# Patient Record
Sex: Male | Born: 1960 | Race: White | Hispanic: No | Marital: Married | Smoking: Never smoker
Health system: Northeastern US, Academic
[De-identification: ages and names within clinical notes are randomized; demographics above are authoritative.]

---

## 2015-09-06 ENCOUNTER — Ambulatory Visit: Admitting: Internal Medicine

## 2015-09-06 LAB — HX BASIC METABOLIC PANEL
CASE NUMBER: 2017163001029
HX ANION GAP: 9 (ref 3.0–11.0)
HX BUN: 14 mg/dL (ref 7.0–18.0)
HX CALCIUM LVL: 9.2 mg/dL (ref 8.5–10.1)
HX CHLORIDE: 102 mmol/L (ref 98.0–107.0)
HX CO2: 29 mmol/L (ref 21.0–32.0)
HX CREATININE: 0.988 mg/dL (ref 0.55–1.3)
HX GLUCOSE LVL: 106 mg/dL (ref 65.0–110.0)
HX POTASSIUM LVL: 4.9 mmol/L (ref 3.6–5.2)
HX SODIUM LVL: 140 mmol/L (ref 136.0–145.0)

## 2015-09-06 LAB — HX CBC W/ DIFF
CASE NUMBER: 2017163001029
HX HCT: 45.4 % (ref 39.0–50.0)
HX HGB: 14.9 g/dL (ref 13.0–17.0)
HX MCH: 29 pg (ref 27.0–31.0)
HX MCHC: 32.8 g/dL (ref 32.0–36.0)
HX MCV: 87 fL (ref 80.0–94.0)
HX MPV: 10.4 fL (ref 7.4–11.5)
HX NRBC PERCENT: 0 %
HX PLATELET: 245 10*3/uL (ref 150.0–400.0)
HX RBC: 5.2 10*6/uL (ref 4.2–5.4)
HX RDW: 12.7 % (ref 11.5–14.5)
HX WBC: 8.8 10*3/uL (ref 3.6–10.5)

## 2015-09-06 LAB — HX .AUTOMATED DIFF
CASE NUMBER: 2017163001029
HX ABSOLUTE NEUTRO COUNT: 6230 /mm3
HX BASOPHILS: 1 % (ref 0.0–1.0)
HX EOSINOPHILS: 2 % (ref 0.0–3.0)
HX IMMATURE GRANULOCYTES: 0 % (ref 0.0–2.0)
HX LYMPHOCYTES: 18 % — ABNORMAL LOW (ref 22.0–40.0)
HX MONOCYTES: 8 % (ref 0.0–11.0)
HX NEU CT MEASURED: 6.23
HX NEUTROPHILS: 71 % (ref 40.0–71.0)

## 2015-09-06 LAB — HX LIPID PANEL
CASE NUMBER: 2017163001029
HX CHOL: 181 mg/dL (ref 140.0–200.0)
HX HDL: 62 mg/dL — ABNORMAL HIGH (ref 41.0–60.0)
HX LDL: 104 mg/dL (ref 0.0–129.0)
HX TRIG: 73 mg/dL (ref 0.0–149.0)

## 2015-09-06 LAB — HX URINALYSIS (COMPLETE)
CASE NUMBER: 2017163001030
HX UA BILIRUBIN: NEGATIVE
HX UA BLOOD: NEGATIVE
HX UA GLUCOSE: NEGATIVE
HX UA KETONES: NEGATIVE
HX UA LEUKOCYTE ESTERASE: NEGATIVE
HX UA NITRITE: NEGATIVE
HX UA PH: 6 (ref 5.0–8.0)
HX UA PROTEIN: NEGATIVE
HX UA RBC: 2 /HPF (ref 0.0–3.0)
HX UA SPECIFIC GRAVITY: 1.011 (ref 1.005–1.03)
HX UA SQUAMOUS EPITHELIAL: <1 (ref 0.0–5.0)
HX UA UROBILINOGEN: NEGATIVE
HX UA WBC: 1 /HPF (ref 0.0–5.0)

## 2015-09-06 LAB — HX GLOMERULAR FILTRATION RATE (ESTIMATED)
CASE NUMBER: 2017163001029
HX AFN AMER GLOMERULAR FILTRATION RATE: >90
HX NON-AFN AMER GLOMERULAR FILTRATION RATE: 85 mL/min/{1.73_m2}

## 2015-09-06 LAB — HX HEPATITIS C ANTIBODY
CASE NUMBER: 2017163001029
HX HEP C AB: NONREACTIVE

## 2015-09-06 LAB — HX PROSTATE SPECIFIC ANTIGEN SCREEN
CASE NUMBER: 2017163001029
HX PSA: 3.01 ng/mL (ref 0.0–4.0)

## 2016-06-10 ENCOUNTER — Ambulatory Visit: Admitting: Internal Medicine

## 2016-06-10 NOTE — Progress Notes (Signed)
* * *        **  Jordan [name], Jordan [name]**    ------    72 Y old Male, DOB: 1960-06-07    [address] [place], [place], [place] [address]    Home: [phone]    Provider: [name]        * * *    Web Encounter    ---    Answered by    Blenda Nicely    Date: 06/10/2016        Time: 07:36 PM    Caller    [name]    ------            Reason    Medical records            Message                       Addressed To:                [name]        <br/> My psychologist said they requested a copy of my medical records.  please let me know what I need to do to give them access to this information.      [facility]      [address]      [place], [place]       [employer]            Thank You, [name]                Action Taken    [facility] 06/12/2016 8:59:57 AM > Faxed records to  [phone]. Sent portal message to patient.                * * *        **eMessages**   From:    Blenda Nicely    ------    Created:    2016-06-12 10:41:30.0    Sent:      Subject:    GM:WNUUVOZ records    Message:    Hi [name]: I have faxed your records to [facility],  Attention: Mardee Postin for you. Regards. Trish                    ---          * * *          Patient: Jordan [name], Jordan [name] DOB: 1960-12-01 Provider: [name]  06/10/2016    ---    Note generated by eClinicalWorks EMR/PM Software ([email or web address])

## 2016-06-14 ENCOUNTER — Ambulatory Visit: Admitting: Internal Medicine

## 2016-06-14 NOTE — Progress Notes (Signed)
* * *        **  TIP, [name]**    ------    69 Y old Male, DOB: May 18, 1960    [address], [place], [place], [place] [address]    Home: [phone]    Provider: [name]        * * *    Web Encounter    ---    Answered by    Blenda Nicely    Date: 06/14/2016        Time: 04:15 PM    Caller    [name]    ------            Reason    Re:RE:Medical records            Message                            Great - Thank you                  .[name]..................................................................-      To :[name]      From :[facility]      Sent :06/12/2016 10:42 AM      Subject :IO:NGEXBMW records            Hi [name]:  I have faxed your records to [facility], Attention:  Mardee Postin for you.  Regards.  Trish                      Action Taken    [employer] 06/15/2016 8:52:33 AM >                * * *                ---          * * *          Patient: [name], [name] DOB: Aug 11, 1960 Provider: [name]  06/14/2016    ---    Note generated by eClinicalWorks EMR/PM Software ([email or web address])

## 2016-06-26 ENCOUNTER — Ambulatory Visit: Admitting: Medical

## 2016-06-26 LAB — HX COMPREHENSIVE METABOLIC PANEL
CASE NUMBER: 2018092002219
HX ALBUMIN LVL: 4.3 g/dL (ref 3.5–5.0)
HX ALKALINE PHOSPHATASE: 58 U/L (ref 45.0–117.0)
HX ALT: 26 U/L (ref 6.0–78.0)
HX ANION GAP: 6 (ref 3.0–11.0)
HX AST: 15 U/L (ref 6.0–40.0)
HX BILIRUBIN TOTAL: 0.4 mg/dL (ref 0.2–1.2)
HX BUN: 16 mg/dL (ref 7.0–18.0)
HX CALCIUM LVL: 9.2 mg/dL (ref 8.5–10.1)
HX CHLORIDE: 101 mmol/L (ref 98.0–110.0)
HX CO2: 29 mmol/L (ref 21.0–32.0)
HX CREATININE: 0.941 mg/dL (ref 0.55–1.3)
HX GLUCOSE LVL: 83 mg/dL (ref 65.0–110.0)
HX POTASSIUM LVL: 4.6 mmol/L (ref 3.6–5.2)
HX SODIUM LVL: 136 mmol/L (ref 136.0–145.0)
HX TOTAL PROTEIN: 7.4 g/dL (ref 6.0–8.0)

## 2016-06-26 LAB — HX GLOMERULAR FILTRATION RATE (ESTIMATED)
CASE NUMBER: 2018092002219
HX AFN AMER GLOMERULAR FILTRATION RATE: >90
HX NON-AFN AMER GLOMERULAR FILTRATION RATE: >90

## 2016-06-26 LAB — HX AMYLASE LEVEL
CASE NUMBER: 2018092002219
HX AMYLASE LVL: 53 U/L (ref 25.0–115.0)

## 2016-06-26 LAB — HX LIPASE LEVEL
CASE NUMBER: 2018092002219
HX LIPASE LVL: 201 U/L (ref 73.0–393.0)

## 2016-06-26 LAB — HX CBC W/ DIFF
CASE NUMBER: 2018092002219
HX HCT: 43.1 % (ref 39.0–50.0)
HX HGB: 14.6 g/dL (ref 13.0–17.0)
HX MCH: 29 pg (ref 27.0–31.0)
HX MCHC: 33.9 g/dL (ref 32.0–36.0)
HX MCV: 84 fL (ref 80.0–94.0)
HX MPV: 9.7 fL (ref 7.4–11.5)
HX NRBC PERCENT: 0 %
HX PLATELET: 225 10*3/uL (ref 150.0–400.0)
HX RBC: 5.11 10*6/uL (ref 4.2–5.4)
HX RDW: 12.6 % (ref 11.5–14.5)
HX WBC: 8.9 10*3/uL (ref 3.6–10.5)

## 2016-06-26 LAB — HX .AUTOMATED DIFF
CASE NUMBER: 2018092002219
HX ABSOLUTE NEUTRO COUNT: 5770 /mm3
HX BASOPHILS: 1 % (ref 0.0–1.0)
HX EOSINOPHILS: 2 % (ref 0.0–3.0)
HX IMMATURE GRANULOCYTES: 1 % (ref 0.0–2.0)
HX LYMPHOCYTES: 23 % (ref 22.0–40.0)
HX MONOCYTES: 9 % (ref 0.0–11.0)
HX NEU CT MEASURED: 5.77
HX NEUTROPHILS: 65 % (ref 40.0–71.0)

## 2016-06-26 NOTE — Progress Notes (Signed)
* * *        **Couser, Gianny M**    ------    56 Y old Male, DOB: [birth year]    Account Number: [id number]    [address], [place], [place]    Home: [phone]    Guarantor: [name] Insurance: [employer] [place], [place] Payer ID:  [id number]    PCP: [name]    Appointment Facility: [facility]        * * *    06/26/2016    Progress Note: Consuelo Pandy, PA    ------    ---        Current Medications    ---    Taking      * lisinopril 10 mg tablet 1 tab(s) orally once a day     ---    * lamotrigine 100 mg tablet 1 tab(s) orally Once a day     ---    * clonazepam 1 mg tablet 1 tab(s) orally Once a day     ---    * Vitamin D3 2000 intl units tablet 2 cap(s) orally once a day     ---    * gabapentin 300 mg capsule 1 cap(s) orally Once a day     ---    * Fluoxetine Hydrochloride 40 mg capsule 1 cap(s) orally once a day     ---    Discontinued    * alprazolam 1 mg tablet 1/2 tab(s) orally BID     ---    * Wellbutrin XL 150 mg/24 hours tablet, extended release 1 tab(s) orally every 24 hours     ---    * losartan 25 mg tablet 1 tab(s) orally once a day     ---    * Medication List reviewed and reconciled with the patient     ---      Past Medical History    ---      L2-3 R-side disc bulge and osteophyte- Spinal Stenosis he has been  Injection stem cells, trying to avoid surgery. [name]        ---    Depression and Anxiety.        ---    HTN.        ---    Colonoscopy 2008 Dr [name] repeat in 2018 no polyps.        ---    2014 Elevated PSA Dr [name]. Status post biopsy.        ---    Insomnia anxiety related.        ---    Allergic rhinitis.        ---    Hx of H pylori.        ---    Psych: [facility] Mardee Postin NP.        ---      Family History    ---      Father: deceased, HTN, Kidney failure, Dyalsis, Heart failure, diagnosed  with Hypertension, Heart Disease    ---    Mother: alive 68 yrs, HTN, diagnosed with Hypertension    ---    Daughter  1: alive 55 yrs    ---    Paternal Grand Father: deceased    ---    Paternal Grand Mother: deceased 14 yrs    ---    Maternal Grand Father: deceased, CHF, diagnosed with Heart Disease    ---  Maternal Grand Mother: deceased, cancer at age 32, diagnosed with Cancer    ---    1 sister(s) - healthy. 1 son(s) , 2 daughter(s) - healthy.    ---    Came to this country when he was 9, [place]    Daughter- Depression issues.    ---      **Social History**    ---    Tobacco Use  Are you a? Never smoker  , Patient was counseled on the dangers  of tobacco use: 06/26/2016  .    no Drug use.    Marital Status: married, 34 yrs married.    Children: sons:1 daughters:2.    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    no Exercise, should be doing more .    no Caffeine, due to high blood pressure.    Sexually active Had sex in the last 12 months (vaginal,anal,oral): Yes, with:  Women only, Use Protection?: No, Prevention strategies discussed: Other, Have  you ever had an STD?: No.    Alcohol (TEXT ONLY): socially.      Allergies    ---      Tdap injection: swelling : Side Effects    ---    Cymbalta: constipation : Side Effects    ---        Reason for Appointment    ---      1\. High BP 155/95 HR 65, 2wks    ---      History of Present Illness    ---    _Interim History_ :    56 yr old male, PMHx and MEDS per below, here c/o elevated BP at home 160/90  for 1 week, mild head pressure and persistent tinnitus which is not new. At  the end of the visit he told me has several months of decreased sense of taste  and smell. Denies fever, chills, sinus pain , pressure, signs of sinus  infection.    He is Also c/o abdominal bloating and early satiety, constantly feeling full.  Denies fever, chills, n/v/d. He has Hx H Pylori, this feels similar. Denies  vomiting, blood in the stool, melena, unexplained weight loss.    He Started Lamotrigine about 1 month ago, 25 mg and working up to 100 mg now.  ? if abdominal symptoms are side effects. He  discussed w/ psychiatrist who did  not think so.      Vital Signs    ---    Wt 165, Ht 66, BP  154/90  ,  **Repeat:162/90** , BMI  26.63  , HR 88, O2 Sat  98%RA.      Examination    ---    Denton Meek Examination_ :    General Appearance:  NAD  ,  alert and oriented  .    Oral cavity:  clear  ,  moist mucus membranes  .    HEENT:  eyes: anicteric.  TM's clear and flat bilaterally  ,  Oropharynx clear  with MMM  .    Neck, Thyroid :  supple  ,  no thyromegaly  ,  no lymphadenopathy  .    Heart:  RRR  ,  no murmurs  .    Lungs:  clear to auscultation bilaterally  ,  no wheezes/rhonchi/rales  .    Abdomen:  nl BS, mild distended  ,  no guarding or rebound  ,  no masses  palpated  ,  no hepatosplenomegaly  .    Extremities:  no  clubbing, no edema  .    Peripheral pulses:  normal  .    Neurologic Exam:  non-focal exam  ,  normal cranial nerves II-XII sensory &  motor WNL, DTR 2 plus  ,  Speech normal  ,  gait normal  ,  no cerebellar  signs  ,  Finger to nose - normal  ,  Romberg - negative  .          Assessments    ---    1\. Essential hypertension - I10 (Primary)    ---    2\. Influenza vaccine refused - Z28.21    ---    3\. Early satiety - R68.81    ---    4\. Depression with anxiety - F41.8    ---    5\. Dysgeusia - R43.2    ---    6\. Anosmia - R43.0    ---      Treatment    ---      **1\. Essential hypertension**    Stop lisinopril tablet, 10 mg, 1 tab(s), orally, once a day    Start lisinopril tablet, 20 mg, 1 tab(s), orally, once a day, 90 day(s), 90,  Refills 3    _IMAGING: EKG_    _IMAGING: Echocardiogram-[facility] (Ordered for 06/26/2016)_    Notes: not to goal. Increase Lisinopril to 20 mg, he will double up on his 10  mg, then fill new rx. f/u BP in 1 week, sooner prn. Discussed importance of  keeping blood pressure well controlled. DASH diet. Take medications daily as  prescribed. Regular cardiovascular exercise 30 min. most days of the week.    EKG w/ LAD. check echo to r/o hypertensive heart disease.    ---         **2\. Influenza vaccine refused**    Notes: pt aware influenza vaccine recommended annually, defers at this time...  and may change their mind and thus refered to Pharmacy if that occurs.        **3\. Early satiety**    _LAB: Amylase Level_    _LAB: CBC w/ Differential_    _LAB: Comprehensive Metabolic Panel_    _LAB: Helicobacter Pylori Antigen_    _LAB: Lipase Level_    Notes: New since starting and titrating Lamotrigine, ? side effect. exam is  benign. ddx also includes GERD, H pylor ( Hx of same ), gastritis,  cholelithiasis, mass ( low suspicion ). Check labs as noted. f/u in 1 week,  sooner prn. if sxs persist, consider imaging such as abd u/s and or CT.    doubt central cause, but given sxs of anosmia and dysgeusia, will check CT  head. consider MRI brain if unrevealing.        **4\. Depression with anxiety**    Continue clonazepam tablet, 1 mg, 1 tab(s), orally, Once a day    Notes: cont. w/ psych. Mood is stable, but struggling w/ weaning from Benzo  and adding Lamotrigine.        **5\. Dysgeusia**    _IMAGING: CT Head or Brain C- e-sch* (Ordered for 06/26/2016)_    Notes: checking CT head, if unrevealing, consider MRI.        **6\. Anosmia**    Notes: see above.        **7\. Others**    Continue lamotrigine tablet, 100 mg, 1 tab(s), orally, Once a day    Continue Vitamin D3 tablet, 2000 intl units, 2 cap(s), orally, once a day    Continue gabapentin  capsule, 300 mg, 1 cap(s), orally, Once a day    Continue Fluoxetine Hydrochloride capsule, 40 mg, 1 cap(s), orally, once a day    Notes: Preventive health care material was published to portal.      Procedure Codes    ---      93000 ELECTROCARDIOGRAM    ---      Follow Up    ---    1 week BP check and f/u echo and CT brain--    Electronically signed by [name] , M.D. on 07/04/2016 at 12:54 PM EDT    Sign off status: Completed        * * *        [facility]    [address]    [place], [place] [address]    Tel: [phone]    Fax:  [phone]              * * *          Patient: JYE, FARISS DOB: 12/24/1960 Progress Note: Consuelo Pandy, PA  06/26/2016    ---    Note generated by eClinicalWorks EMR/PM Software ([email or web address])

## 2016-06-27 ENCOUNTER — Ambulatory Visit: Admitting: Medical

## 2016-06-29 LAB — HX HELICOBACTER PYLORI ANTIGEN
CASE NUMBER: 2018093001715
HX H PYLORI AG: NEGATIVE

## 2016-07-03 ENCOUNTER — Ambulatory Visit: Admitting: Medical

## 2016-07-04 ENCOUNTER — Ambulatory Visit: Admitting: Medical

## 2016-07-04 NOTE — Progress Notes (Signed)
* * *        **Jordan [name], Jordan [name]**    ------    56 Y old Male, DOB: Aug 21, 1960    Account Number: [id number]    [address] [place], [place]    Home: [phone]    Guarantor: [name] Insurance: [employer] [place], [place] Payer ID:  [id number]    PCP: [name]    Appointment Facility: [facility]        * * *    07/04/2016    Progress Note: Consuelo Pandy, PA    ------    ---        Current Medications    ---    Taking      * lisinopril 20 mg tablet 1 tab(s) orally once a day     ---    * lamotrigine 25 mg tablet 3 tab(s) orally Once a day     ---    * clonazepam 1 mg tablet 1 tab(s) orally Once a day     ---    * Vitamin D3 2000 intl units tablet 2 cap(s) orally once a day     ---    * gabapentin 300 mg capsule 1 cap(s) orally Once a day     ---    * Fluoxetine Hydrochloride 40 mg capsule 1 cap(s) orally once a day     ---    * Medication List reviewed and reconciled with the patient     ---      Past Medical History    ---      L2-3 R-side disc bulge and osteophyte- Spinal Stenosis he has been  Injection stem cells, trying to avoid surgery. [name]        ---    Depression and Anxiety.        ---    HTN.        ---    Colonoscopy 2008 Dr [name] repeat in 2018 no polyps.        ---    2014 Elevated PSA Dr [name]. Status post biopsy.        ---    Insomnia anxiety related.        ---    Allergic rhinitis.        ---    Hx of H pylori.        ---    [facility]; [name] therapist 1  x per month.        ---    Echo- normal function. EF 65 %. mildly dilated ascending aorta... repeat in 6  months..        ---      **Social History**    ---    Tobacco Use  Are you a? Never smoker  , Patient was counseled on the dangers  of tobacco use: 07/04/2016  .    no Drug use.    Marital Status: married, 57 yrs married.    Children: sons:1 daughters:2.    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    no Exercise, should be doing more .    no Caffeine,  due to high blood pressure.    Sexually active Had sex in the last 12 months (vaginal,anal,oral): Yes, with:  Women only, Use Protection?: No, Prevention strategies discussed: Other, Have  you ever had an STD?: No.    Alcohol (TEXT ONLY): socially.      Allergies    ---  Tdap injection: swelling : Side Effects    ---    Cymbalta: constipation : Side Effects    ---        Reason for Appointment    ---      1\. 1 wk f/u fBP    ---      History of Present Illness    ---    _Interim History_ :    56 yr old male, PMHx and MEDS per below, here for 1 week BP check. Lisinopril  was increased from 10 mg to 20 mg last week. He is still getting some high  readings at home 150's/ 80-90. Still c/o mild head pressure and persistent  tinnitus which is not new.    Abd bloating , early satiety still present, but not as bad. Labs were un  revealing. H pylori was negative. Denies vomiting, blood in the stool, melena,  unexplained weight loss.    He still thinks sxs are related to Lamotrigine and will again discuss w/ his  psych prescriber. She advised him to decrease from 100 mg to 75 mg, but didn't  think sxs were due to med.    He had to reschedule CT head ordered for anosmia and dysgeusia. There have  been no changes in his symptoms.      Vital Signs    ---    Wt 165, Ht 66, BP  120/90  , BMI  26.63  , HR 76, O2 Sat 98%RA.      Examination    ---    Denton Meek Examination_ :    General Appearance:  NAD  ,  alert and oriented  .    Oral cavity:  clear  ,  moist mucus membranes  .    HEENT:  eyes: anicteric.  TM's clear and flat bilaterally  ,  Oropharynx clear  with MMM  .    Neck, Thyroid :  supple  ,  no thyromegaly  ,  no lymphadenopathy  .    Heart:  RRR  ,  no murmurs  .    Lungs:  clear to auscultation bilaterally  ,  no wheezes/rhonchi/rales  .    Abdomen:  nl BS, mild distended  ,  no guarding or rebound  ,  no masses  palpated  ,  no hepatosplenomegaly  .    Extremities:  no clubbing, no edema  .    Peripheral  pulses:  normal  .    Neurologic Exam:  non-focal exam  ,  normal cranial nerves II-XII sensory &  motor WNL, DTR 2 plus  ,  Speech normal  ,  gait normal  ,  no cerebellar  signs  ,  Finger to nose - normal  ,  Romberg - negative  .          Assessments    ---    1\. Essential hypertension - I10 (Primary)    ---    2\. Influenza vaccine refused - Z28.21    ---    3\. Depression with anxiety - F41.8    ---    4\. Dysgeusia - R43.2    ---    5\. Anosmia - R43.0    ---    6\. Bloating - R14.0    ---      Treatment    ---      **1\. Essential hypertension**    Start hydrochlorothiazide-lisinopril tablet, 25 mg-20 mg, 1 tab(s), orally,  once a day, 30  day(s), 30, Refills 6    Continue lisinopril tablet, 20 mg, 1 tab(s), orally, once a day    Notes: Still not to goal. Adding HCTZ to Lisinopril. f/u BP iin 2-3 weeks,  sooner prn. cont to avoid NSAIDs. Cont w/ DASH diet, exercise.    Echo- normal function. EF 65 %. mildly dilated ascending aorta... repeat in 6  months- see next visit and we 07/05/2016.    ---        **2\. Influenza vaccine refused**    Notes: pt aware influenza vaccine recommended annually, defers at this time...  and may change their mind and thus refered to Pharmacy if that occurs.        **3\. Depression with anxiety**    Continue clonazepam tablet, 1 mg, 1 tab(s), orally, Once a day    Notes: cont care w/ psych. he will again discuss meds, as he is frustrated w/  how he is feeling and really belives sxs worse w/ Lamotrigine.        **4\. Dysgeusia**    _IMAGING: Consult (Ordered for 07/04/2016)_    Notes: pending CT, refer to neurology.        **5\. Anosmia**    Notes: pending CT, refer to neurology.        **6\. Bloating**    Notes: New since starting and titrating Lamotrigine, ? side effect. exam is  benign. H pylor negative. DX gastritis,GERD, cholelithiasis, mass ( low  suspicion ). labs were stable. sxs are improved, though not resolved. He is  still working w/ psych to change MEDS. if no relief  of GI sxs, pursue further  work up. weight stable.        **7\. Others**    Continue lamotrigine tablet, 25 mg, 3 tab(s), orally, Once a day    Continue Vitamin D3 tablet, 2000 intl units, 2 cap(s), orally, once a day    Continue gabapentin capsule, 300 mg, 1 cap(s), orally, Once a day    Continue Fluoxetine Hydrochloride capsule, 40 mg, 1 cap(s), orally, once a day    Notes: Preventive health care material was published to portal.      Follow Up    ---    2-3 weks BP check.    Electronically signed by [name] , [name] on 07/10/2016 at 07:47 PM EDT    Sign off status: Completed        * * *        Jordan [name]    [address]    [place], [place] [address]    Tel: [phone]    Fax: [phone]              * * *          Patient: LAVARR, PRESIDENT DOB: [birth year] Progress Note: Consuelo Pandy, PA  07/04/2016    ---    Note generated by eClinicalWorks EMR/PM Software ([email or web address])

## 2016-07-05 ENCOUNTER — Ambulatory Visit: Admitting: Internal Medicine

## 2016-07-05 NOTE — Progress Notes (Signed)
* * *        **  Jordan [name], Jordan [name]**    ------    72 Y old Male, DOB: 06-Nov-1960    [address], [place], [place], [place] [address]    Home: [phone]    Provider: [name]        * * *    Web Encounter    ---    Answered by    Consuelo Pandy    Date: 07/05/2016        Time: 02:16 PM    Reason    Echo    ------            Message                      Echo- normal function. EF 65 %. mildly dilated ascending aorta... repeat in 6 months                Action Taken    Mansur,Tarina 07/05/2016 2:16:46 PM > Hi Kenneth, I left you a  message as well. I know we discussed your echo yesterday. It does show normal  function. There is mild dilation of the ascending aorta ( the large vessel  that comes off of the heart ).. this can sometimes happen w/ high blood  pressure. we will just need to watch this. repeat the echo in 6 months and  ensure we get the Blood pressure controlled. we will see you in a couple of  weeks as planned. take care, Consuelo Pandy, PA-C                * * *        **eMessages**   From:    Mansur,Tarina    ------    Created:    2016-07-05 14:18:35.0    Sent:      Subject:    BJ:YNWG    Message:                      Mansur,Tarina  07/05/2016 2:16:46 PM >  Hi Demetrio, I left you a message as well. I know we discussed your echo yesterday.  It does show normal function.  There is mild dilation of the ascending aorta ( the large vessel that comes off of the heart )..  this can sometimes happen w/ high blood pressure.  we will just need to watch this.  repeat the echo in 6 months and ensure we get the Blood pressure controlled. we will see you in a couple of weeks as planned. take care, Consuelo Pandy, PA-C                        ---          * * *          Patient: Jordan [name], Jordan [name] DOB: [birth year] Provider: [name]  07/05/2016    ---    Note generated by eClinicalWorks EMR/PM Software ([email or web address])

## 2016-07-06 ENCOUNTER — Ambulatory Visit: Admitting: Internal Medicine

## 2016-07-06 ENCOUNTER — Ambulatory Visit

## 2016-07-06 LAB — HX VITAMIN D 25 HYDROXY LEVEL (RECOMMENDED)
CASE NUMBER: 2018102000767
HX VITAMIN D 25 OH LVL: 31 ng/mL (ref 30.0–100.0)

## 2016-07-06 LAB — HX IRON PROFILE
CASE NUMBER: 2018102000767
HX % IRON BOUND: 27 % (ref 10.0–50.0)
HX FERRITIN LVL: 250 ng/mL (ref 26.0–388.0)
HX IRON: 95 ug/dL (ref 65.0–175.0)
HX TIBC: 347 ug/dL (ref 250.0–450.0)

## 2016-07-06 LAB — HX MAGNESIUM LEVEL
CASE NUMBER: 2018102000767
HX MAGNESIUM LVL: 2.2 mg/dL (ref 1.8–2.4)

## 2016-07-06 LAB — HX VITAMIN B12 LEVEL
CASE NUMBER: 2018102000767
HX VITAMIN B12 LVL: 1805 pg/mL — ABNORMAL HIGH (ref 193.0–986.0)

## 2016-07-06 LAB — HX TSH
CASE NUMBER: 2018102000767
HX 3RD GEN TSH: 2.16 u[IU]/mL (ref 0.358–3.74)

## 2016-07-06 NOTE — Progress Notes (Signed)
* * *        **  Jordan [name], Jordan [name]**    ------    32 Y old Male, DOB: 11/21/1960    [address], [place], [place], [place] [address]    Home: [phone]    Provider: [name]        * * *    Web Encounter    ---    Answered by    Carlynn Herald    Date: 07/06/2016        Time: 08:20 AM    Reason    fyi    ------            Message                      Patient stopped by the office to Let TM know he is doing very well and thanks for your concern. He will be seeing you on his f/u in May 1st.                Action Taken    Caldas,Dieniffer 07/06/2016 8:21:38 AM > Mansur,Tarina  07/06/2016 11:35:21 AM >                * * *                ---          * * *          Patient: Jordan [name], Jordan [name] DOB: 09/20/1960 Provider: [name]  07/06/2016    ---    Note generated by eClinicalWorks EMR/PM Software ([email or web address])

## 2016-07-07 LAB — HX TESTOSTERONE, FREE TOTAL MALE
CASE NUMBER: 2018102000767
HX SEX HORMONE BINDING GLOBULIN: 43 nmol/L
HX TESTOS FREE: 65 pg/mL
HX TESTOSTER % FREE: 1.6 %
HX TESTOSTERONE, ADULT MALE: 410 ng/dL

## 2016-07-07 LAB — HX LAMOTRIGINE LEVEL
CASE NUMBER: 2018102000767
HX LAMOTRIGINE LVL: <0.9 — ABNORMAL LOW

## 2016-07-07 LAB — HX LYME ANTIBODIES W/ RFLX IB
CASE NUMBER: 2018102000767
HX LYME ABS TOT: 0.18 {index}

## 2016-07-08 LAB — HX GABAPENTIN LEVEL
CASE NUMBER: 2018102000767
HX GABAPENTIN LVL: 2 ug/mL

## 2016-07-10 ENCOUNTER — Ambulatory Visit: Admitting: Medical

## 2016-07-10 NOTE — Progress Notes (Signed)
* * *        **[name], [name]**    ------    58 Y old Male, DOB: 02/07/1961    [address], [place], [place], [place] [address]    Home: [phone]    Provider: Consuelo [name]        * * *    Web Encounter    ---    Answered by    Jordan [name]    Date: 07/10/2016        Time: 06:11 PM    Reason    abdominal bloating, early satiety/ head CT/ pending pt response    ------            Message                      He is still working w/ psych to change MEDS. if no relief of GI sxs, pursue further work up. weight stable.                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 pending neuro consult 08/14/16 Khrystyna Schwalm 07/12/2016  5:00:34 PM >called pt. LM. Hi Jordan [name], the CT showed sinus inflammation and  possibly the cerebllar tonsils are laying low. this could be causing the loss  of taste and smell. please keep appt w/ neurologist, as you may need further  work up and MRI .[name]. in the meantime, i will send a prescription for augmentin  to treat the sinus infection. please call or respond here so we know you have  rec'd this message. Also, we will consider referral to Gastroenterologist if  you cntinue to have stomach issues. please let me know. thanks, Jordan [name], Jordan [name]  Ples Trudel 07/13/2016 12:20:25 PM > visit 5/1            Refills    Start Augmentin tablet, 875 mg-125 mg, orally, 20, 1 tab(s), every  12 hours, 10 day(s), Refills=0    ------          * * *        **eMessages**   From:    Jordan [name]    ------    Created:    2016-07-12 17:04:43.0    Sent:    2016-07-12 17:06:31.0    Subject:    QI:ONGEXBMWU bloating, early satiety/ head CT    Message:                      Hi Jordan [name], the CT showed sinus inflammation and possibly the cerebllar tonsils are laying low. this could be causing the loss of taste and smell.   please keep appt w/ neurologist, as you may need  further work up and MRI .[name].  in the meantime, i will send a prescription for augmentin to treat the sinus infection. please call or respond here so we know you have rec'd this message.  Also, we will consider referral to Gastroenterologist if you cntinue to have stomach issues. please let me know. thanks, Jordan [name], Jordan [name]                        ---          * * *          Patient: Jordan [name], Jordan [name] DOB: [birth year] Provider: Consuelo [name]  07/10/2016    ---  Note generated by eClinicalWorks EMR/PM Software ([email or web address])

## 2016-07-11 ENCOUNTER — Ambulatory Visit: Admitting: Internal Medicine

## 2016-07-11 NOTE — Progress Notes (Signed)
* * *        **  Jordan [name], Jordan [name]**    ------    47 Y old Male, DOB: September 17, 1960    [address], [place], [place], [place] [address]    Home: [phone]    Provider: [name]        * * *    Telephone Encounter    ---    Answered by    Macie Burows    Date: 07/11/2016        Time: 11:14 AM    Reason    psych -LM    ------            Message                      Victorino Dike, NP  from [facility] would like to speak w/ [name] regarding patient.   [phone]                Action Taken    Clements,Maria 07/11/2016 11:15:59 AM > Geoffroy,Jenn  07/11/2016 11:34:52 AM > Cathe Bilger 07/11/2016 12:59:33 PM > Betsy please  call and see what is happening.Neoma Laming [name],Betsy 07/11/2016 1:15:33 PM > LM  with secretary requesting callback Andre,Guilherme Z 07/11/2016 4:12:36 PM >  Pscy called back... find out what is going on medical, and psychiatrically.  she will be back omrorow from 7am to 4:30pm [name],Betsy 07/11/2016 5:08:15 PM  > JG... if you can help on Wednesday. [name],Betsy 07/13/2016 9:10:59 AM >  Victorino Dike only works Media pl[employer]. Carlisle Beers 07/17/2016 12:58:32 PM  >Tarina...fyi coming next week....can you address...back in! [name],Betsy  07/17/2016 4:13:51 PM > Able to connect with Victorino Dike. Discussed course of  medical care from 04/02 onward. She reports patient persistent daytime  irritability. He has since decreased Lamotrigne 50mg  daily. Meds updated in  VV. Asking if you would consider Sleep Study. Requested copy of office notes  faxed to [phone] as well as upcoming 07/25/16 appointment notes  Mansur,Tarina 07/18/2016 12:26:31 PM > will discuss 07/25/16                * * *                ---          * * *          Patient: Jordan [name], Jordan [name] DOB: 18-Feb-1961 Provider: [name]  07/11/2016    ---    Note generated by eClinicalWorks EMR/PM Software ([email or web address])

## 2016-07-12 ENCOUNTER — Ambulatory Visit: Admitting: Medical

## 2016-07-13 ENCOUNTER — Ambulatory Visit: Admitting: Internal Medicine

## 2016-07-13 NOTE — Progress Notes (Signed)
* * *        **  Jordan [name], Jordan [name]**    ------    32 Y old Male, DOB: [birth year]    [address] [place], [place], [place] [address]    Home: [phone]    Provider: [name]        * * *    Web Encounter    ---    Answered by    Blenda Nicely    Date: 07/13/2016        Time: 06:33 AM    Caller    Tyreon Collier    ------            Reason    Re:RE:abdominal bloating, early satiety/ head CT            Message                      Thank you for letting me know.  I will pick up the medication today.  and keep the appointment with the nero..            thank you for your help - Chivas                              .[name]..................................................................-      To :Geovany Lawrence      From :[facility]      Sent :07/12/2016 05:06 PM      Subject :BJ:YNWGNFAOZ bloating, early satiety/ head CT             Hi Carrington, the CT showed sinus inflammation and possibly the cerebllar tonsils are laying low. this could be causing the loss of taste and smell.   please keep appt w/ neurologist, as you may need further work up and MRI .[name].  in the meantime, i will send a prescription for augmentin to treat the sinus infection. please call or respond here so we know you have rec'd this message.  Also, we will consider referral to Gastroenterologist if you cntinue to have stomach issues. please let me know. thanks, Nathanial Rancher, [place]                            Action Taken    [facility] 07/13/2016 7:52:42 AM > Mansur,Tarina  07/13/2016 10:49:32 AM > thank you Shelley                * * *        **eMessages**   From:    Mansur,Tarina    ------    Created:    2016-07-13 10:49:49.0    Sent:      Subject:    RE:Re:RE:abdominal bloating, early satiety/ head CT    Message:                      Mansur,Tarina  07/13/2016 10:49:32 AM > thank you Gerren                        ---          * * *          Patient: Jordan [name], Jordan [name] DOB: 1960/10/19 Provider: [name]  07/13/2016     ---    Note generated by eClinicalWorks EMR/PM Software ([email or web address])

## 2016-07-23 ENCOUNTER — Ambulatory Visit: Admitting: Medical

## 2016-07-23 NOTE — Progress Notes (Signed)
* * *        **  HAMISH, BANKS**    ------    15 Y old Male, DOB: [birth year]    [address] [place], [place], [place] [address]    Home: [phone]    Provider: Consuelo Pandy        * * *    Web Encounter    ---    Answered by    Blenda Nicely    Date: 07/23/2016        Time: 03:15 PM    Caller    Samil Humble    ------            Reason    Sever hemroids            Message                       Addressed To: Quaneisha Hanisch        <br/> Hi I have been experiencing sever hemroids for about two week now.   I have tried over the counter preparation H and Rectialcare.   As well as soaking in warm water.   Is there something else you would subscribe for this problem.  Thanks [name]                 Action Taken    [facility] 07/24/2016 8:54:42 AM > Caison Hearn  07/24/2016 9:40:09 AM > Hi [name], let's try Anusol HC suppositories. If they  don't help, please let me know. thanks, Simmie Garin            Refills    Start Anusol-HC suppository, 25 mg, rectally, 28, 1 SUPP(s), 2  times a day, 14 day(s), Refills=0    ------          * * *        **eMessages**   From:    Sandon Yoho    ------    Created:    2016-07-24 09:40:44.0    Sent:      Subject:    BJ:YNWGN hemroids    Message:                      Camila Maita  07/24/2016 9:40:09 AM > Hi [name], let's try Anusol HC suppositories. If they don't help, please let me know. thanks, Nathanial Rancher                        ---          * * *          Patient: MORRILL, BOMKAMP DOB: Jul 04, 1960 Provider: Consuelo Pandy  07/23/2016    ---    Note generated by eClinicalWorks EMR/PM Software ([email or web address])

## 2016-07-24 ENCOUNTER — Ambulatory Visit: Admitting: Internal Medicine

## 2016-07-24 NOTE — Progress Notes (Signed)
* * *        **  DEMPSEY, AHONEN**    ------    10 Y old Male, DOB: November 04, 1960    [address], [place], [place], [place] [address]    Home: [phone]    Provider: [name]        * * *    Web Encounter    ---    Answered by    Blenda Nicely    Date: 07/24/2016        Time: 03:06 PM    Caller    Damarius Esters    ------            Reason    Re:RE:Sever hemroids            Message                            Thank you, I will try this and let you know how it works.      [name]                  .[name]..................................................................-      To :Ercell Shehan      From :[facility]      Sent :07/24/2016 09:42 AM      Subject :DG:UYQIH hemroids             Mansur,Tarina  07/24/2016 9:40:09 AM > Hi [name], let's try Anusol HC suppositories. If they don't help, please let me know. thanks, Nathanial Rancher                            Action Taken    [facility] 07/24/2016 3:31:15 PM > Mansur,Tarina  07/24/2016 5:05:41 PM > you're welcome. thank you                * * *        **eMessages**   From:    Mansur,Tarina    ------    Created:    2016-07-24 17:05:52.0    Sent:      Subject:    RE:Re:RE:Sever hemroids    Message:                      Mansur,Tarina  07/24/2016 5:05:41 PM > you're welcome.  thank you                        ---          * * *          Patient: Jordan [name], Jordan [name] DOB: Oct 23, 1960 Provider: [name]  07/24/2016    ---    Note generated by eClinicalWorks EMR/PM Software ([email or web address])

## 2016-07-25 ENCOUNTER — Ambulatory Visit: Admitting: Medical

## 2016-07-25 NOTE — Progress Notes (Signed)
* * *        **Mailhot, Verdie M**    ------    56 Y old Male, DOB: 10/20/1960    Account Number: [id number]    [address] [place], [place]    Home: [phone]    Guarantor: [name] Insurance: [employer] [place] Payer ID:  [id number]    PCP: [name]    Appointment Facility: [facility]        * * *    07/25/2016    Progress Note: Consuelo Pandy, PA    ------    ---        Current Medications    ---    Taking      * hydrochlorothiazide-lisinopril 25 mg-20 mg tablet 1 tab(s) orally once a day     ---    * lamotrigine 25 mg tablet 2 tab(s) orally Once a day     ---    * Vitamin D3 2000 intl units tablet 2 cap(s) orally once a day     ---    * gabapentin 300 mg capsule 1 cap(s) orally Once a day     ---    * Fluoxetine Hydrochloride 40 mg capsule 1 cap(s) orally once a day     ---    * Anusol-HC 25 mg suppository 1 SUPP(s) rectally 2 times a day     ---    Discontinued    * lisinopril 20 mg tablet 1 tab(s) orally once a day     ---    * clonazepam 1 mg tablet 1 tab(s) orally Once a day     ---    * Augmentin 875 mg-125 mg tablet 1 tab(s) orally every 12 hours     ---    * Medication List reviewed and reconciled with the patient     ---      Past Medical History    ---      L2-3 R-side disc bulge and osteophyte- Spinal Stenosis he has been  Injection stem cells, trying to avoid surgery. [name]        ---    Depression and Anxiety.        ---    HTN.        ---    Colonoscopy 2008 Dr [name] repeat in 2018 no polyps.        ---    2014 Elevated PSA Dr [name]. Status post biopsy.        ---    Insomnia anxiety related.        ---    Allergic rhinitis.        ---    Hx of H pylori.        ---    [facility] Mardee Postin NP; [name] therapist 1  x per month.        ---    Echo- normal function. EF 65 %. mildly dilated ascending aorta... repeat in 6  months.[name]        ---    CT brain mucosal thickening of paranasal sinus and mild tonsillary  ectopia.  pending neuro consult 08/14/16.        ---      **Social History**    ---    Tobacco Use  Are you a? Never smoker  , Patient was counseled on the dangers  of tobacco use: 07/25/2016  .    no Drug use.  Marital Status: married, 34 yrs married.    Children: sons:1 daughters:2.    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    no Exercise, should be doing more .    no Caffeine, due to high blood pressure.    Sexually active Had sex in the last 12 months (vaginal,anal,oral): Yes, with:  Women only, Use Protection?: No, Prevention strategies discussed: Other, Have  you ever had an STD?: No.    Alcohol (TEXT ONLY): socially.      Allergies    ---      Tdap injection: swelling : Side Effects    ---    Cymbalta: constipation : Side Effects    ---        Reason for Appointment    ---      1\. 2-3 wks f/u BP    ---    2\. Echo- normal function. EF 65 %. mildly dilated ascending aorta... repeat  in 6 months    ---    3\. if still abd pain, then refer to GI    ---      History of Present Illness    ---    _Interim History_ :    56 yr old male, PMHx and MEDS per below, here for    1\. f/u BP. Doing better w/ Lisinopril. HCTZ combo.    2\. f/u sinusitis as seen on CT . treated w/ Augmentin. pending appt w/ Neuro  for anosmia and dysgeusia. sxs are improved, but still does not have good  sense of taste or smell.    3\. GI sxs. recently c/o hemorrhoids. script sent to Anusol suppositories- he  is on day 2, he thinks they are helping. sxs started after taking the  antibiotics for sinusitis. he had 2 loose stools which flared sxs. stools now  back to normal. . Bloating and early satiety improved. [name]..    4\. BQ was Able to connect with Victorino Dike, psych. Discussed course of medical  care from 04/02 onward. She reports patient persistent daytime irritability.  He has since decreased Lamotrigine 50mg  daily. MEDS updated in VV. Asking if  you would consider Sleep Study. Requested copy of office notes faxed to  [phone] as well  as upcoming 07/25/16 appointment notes    Pt definitely feels better on lower dose of Lamotrigine.      Vital Signs    ---    Wt 164, Ht 66, BP 128/80, BMI  26.47  , HR 66, O2 Sat 98%RA.      Examination    ---    Denton Meek Examination_ :    General Appearance:  NAD  ,  alert and oriented  .    HEENT:  TM's left w/ mild serous fluid. right is normal. nose:mild inflamed  nasal turbs.  ,  Oropharynx clear with MMM  .    Neck, Thyroid :  supple  ,  no thyromegaly  ,  no lymphadenopathy  .    Heart:  RRR  ,  no murmurs  .    Lungs:  clear to auscultation bilaterally  ,  no wheezes/rhonchi/rales  .          Assessments    ---    1\. Essential hypertension - I10 (Primary)    ---    2\. Dilated aortic root - I77.810    ---    3\. Depression with anxiety - F41.8    ---    4\. Bloating - R14.0    ---  5\. Influenza vaccine refused - Z28.21    ---    6\. Sleep disturbance - G47.9    ---    7\. 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 Essential hypertension**    Notes: stable and to goal. Discussed importance of keeping blood pressure well  controlled. DASH diet. Take medications daily as prescribed. Regular  cardiovascular exercise 30 min. most days of the week.    ---        **2\. Dilated aortic root**    _IMAGING: Echocardiogram-[facility] (Ordered for 01/25/2017)_    Notes: repeat echo in 6 months.        **3\. Depression with anxiety**    Notes: working w/ psych. see HPI. denies SI or HI.        **4\. Bloating**    Notes: improved.        **5\. Influenza vaccine refused**    Notes: pt aware influenza vaccine recommended annually, defers at this time...  and may change their mind and thus refered to Pharmacy if that occurs.        **6\. Sleep disturbance**    _IMAGING: Polysomnogram (Ordered for 07/25/2016)_    Notes: check sleep study at recommendation of pt's psych. pt admits to sleep  disturbance, but attributes to tinnitus, anxiety. He is willing to have sleep  study.        **7\. Tinnitus  of both ears**    Notes: consider Nortriptyline- he will discuss w/ psych, Victorino Dike. discussed  coping such as white noise when trying to sleep. f/u prn.        **8\. Others**    Notes: Tinnitus material was published to portal.      Follow Up    ---    CPE, sooner prn.    Electronically signed by [name] , M.D. on 07/25/2016 at 03:25 PM EDT    Sign off status: Completed        * * *        [facility]    [address]    [place], [place] [address]    Tel: [phone]    Fax: [phone]              * * *          Patient: Jordan [name], Jordan [name] DOB: December 20, 1960 Progress Note: Consuelo Pandy, PA  07/25/2016    ---    Note generated by eClinicalWorks EMR/PM Software ([email or web address])

## 2016-09-11 ENCOUNTER — Ambulatory Visit: Admitting: Internal Medicine

## 2016-09-11 LAB — HX URINALYSIS (COMPLETE)
CASE NUMBER: 2018169000705
HX UA BILIRUBIN: NEGATIVE
HX UA BLOOD: NEGATIVE
HX UA GLUCOSE: NEGATIVE
HX UA KETONES: NEGATIVE
HX UA LEUKOCYTE ESTERASE: NEGATIVE
HX UA NITRITE: NEGATIVE
HX UA PH: 6 (ref 5.0–8.0)
HX UA PROTEIN: NEGATIVE
HX UA RBC: <1 (ref 0.0–3.0)
HX UA SPECIFIC GRAVITY: 1.011 (ref 1.005–1.03)
HX UA SQUAMOUS EPITHELIAL: <1 (ref 0.0–5.0)
HX UA UROBILINOGEN: NEGATIVE
HX UA WBC: <1 (ref 0.0–5.0)

## 2016-09-11 LAB — HX BASIC METABOLIC PANEL
CASE NUMBER: 2018169000704
HX ANION GAP: 7 (ref 3.0–11.0)
HX BUN: 17 mg/dL (ref 7.0–18.0)
HX CALCIUM LVL: 9.7 mg/dL (ref 8.5–10.1)
HX CHLORIDE: 101 mmol/L (ref 98.0–110.0)
HX CO2: 31 mmol/L (ref 21.0–32.0)
HX CREATININE: 1.08 mg/dL (ref 0.55–1.3)
HX GLUCOSE LVL: 106 mg/dL (ref 65.0–110.0)
HX POTASSIUM LVL: 4.7 mmol/L (ref 3.6–5.2)
HX SODIUM LVL: 139 mmol/L (ref 136.0–145.0)

## 2016-09-11 LAB — HX LIPID PANEL
CASE NUMBER: 2018169000704
HX CHOL: 210 mg/dL — ABNORMAL HIGH (ref 140.0–200.0)
HX HDL: 62 mg/dL — ABNORMAL HIGH (ref 41.0–60.0)
HX LDL: 134 mg/dL — ABNORMAL HIGH (ref 0.0–129.0)
HX TRIG: 72 mg/dL (ref 0.0–149.0)

## 2016-09-11 LAB — HX PROSTATE SPECIFIC ANTIGEN SCREEN
CASE NUMBER: 2018169000704
HX PSA: 2.5 ng/mL (ref 0.0–4.0)

## 2016-09-11 LAB — HX GLOMERULAR FILTRATION RATE (ESTIMATED)
CASE NUMBER: 2018169000704
HX AFN AMER GLOMERULAR FILTRATION RATE: 88 mL/min/{1.73_m2}
HX NON-AFN AMER GLOMERULAR FILTRATION RATE: 76 mL/min/{1.73_m2}

## 2016-09-11 NOTE — Progress Notes (Signed)
* * *        **Carstens, Margarita M**    ------    56 Y old Male, DOB: November 11, 1960    Account Number: [id number]    [address], [place], [place]    Home: [phone]    Guarantor: [name] Insurance: [employer] [place], [place] Payer ID:  [id number]    Appointment Facility: [facility] Office        * * *    09/11/2016    Progress Note: [name], M.D.    ------    ---        Current Medications    ---    Taking      * lamotrigine 25 mg tablet 2 tab(s) orally Once a day     ---    * Vitamin D3 2000 intl units tablet 2 cap(s) orally once a day     ---    * Fluoxetine Hydrochloride 40 mg capsule 1 cap(s) orally once a day     ---    * Anusol-HC 25 mg suppository 1 SUPP(s) rectally 2 times a day     ---    * clonazepam 0.5 mg tablet 1 tab(s) orally every other day     ---    * hydrochlorothiazide-lisinopril 25 mg-20 mg tablet 1 tab(s) orally once a day     ---    Discontinued    * lisinopril 40 mg tablet 1 tab(s) orally once a day     ---    * gabapentin 300 mg capsule 1 cap(s) orally Once a day     ---    * Medication List reviewed and reconciled with the patient     ---      Past Medical History    ---      L2-3 R-side disc bulge and osteophyte- Spinal Stenosis he has been  Injection stem cells, trying to avoid surgery. [name]        ---    Depression and Anxiety.        ---    HTN.        ---    Colonoscopy 2008 Dr [name] repeat in 2018 no polyps.        ---    2014 Elevated PSA Dr [name]. Status post biopsy.        ---    Insomnia anxiety related.        ---    Allergic rhinitis.        ---    Hx of H pylori.        ---    [facility] Mardee Postin NP; [name] therapist 1  x per month.        ---    Echo- normal function. EF 65 %. mildly dilated ascending aorta... repeat in 6  months.[name]        ---    CT brain mucosal thickening of paranasal sinus and mild tonsillary ectopia.  pending neuro consult 08/14/16.        ---    2017 Hep C Screen.. Negative.         ---      Surgical History    ---      Prostate Biopsy negative 2014    ---      Family History    ---      Father: deceased, HTN, Kidney failure, Dyalsis, Heart failure, diagnosed  with Hypertension, Heart  Disease    ---    Mother: alive 75 yrs, HTN, diagnosed with Hypertension    ---    Daughter 1: alive 6 yrs    ---    Paternal Grand Father: deceased    ---    Paternal Grand Mother: deceased 52 yrs    ---    Maternal Grand Father: deceased, CHF, diagnosed with Heart Disease    ---    Maternal Grand Mother: deceased, cancer at age 54, diagnosed with Cancer    ---    1 sister(s) - healthy. 1 son(s) , 2 daughter(s) - healthy.    ---    Came to this country when he was 9, [place]    Daughter- Depression issues.    ---      **Social History**    ---    Tobacco Use    Are you a? _Never smoker_    Patient was counseled on the dangers of tobacco use: _06/18/2018_    no Drug use.    Marital Status: married, 34 yrs married.    Children: sons:1 daughters:2.    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    no Exercise, should be doing more .    no Caffeine, due to high blood pressure.    Sexually active Had sex in the last 12 months (vaginal,anal,oral): Yes, with:  Women only, Use Protection?: No, Prevention strategies discussed: Other, Have  you ever had an STD?: No.    Depression Screening I    Little interest or pleasure in doing things _Not at all_    Feeling down, depressed, or hopeless _Not at all_    Trouble falling or staying asleep, or sleeping too much _Several days_    Feeling tired or having little energy _Not at all_    Poor appetite or overeating _Not at all_    Feeling bad about yourself - or that you are a failure or have let yourself or  your family down _Not at all_    Trouble concentrating on things, such as reading the newspaper or watching  television _Not at all_    Moving or speaking so slowly that other people could have noticed. Or the  opposite-being so figety or resltess that you have been moving  around a lot  more than usual _Not at all_    Thoughts that you would be better off dead, or of hurting yourself _Not at  all_    Total Score _1_    Interpretation _Minimal Depression_    Depression Screening II    If you checked off any problems, how difficult have these problems made it for  you to do your work, take care of of things at home, or get along with other  people _Not difficult at all_    Alcohol (TEXT ONLY): socially.      Allergies    ---      Tdap injection: swelling : Side Effects    ---    Cymbalta: constipation : Side Effects    ---      Hospitalization/Major Diagnostic Procedure    ---      Past Hospitalization Voluntary 3 days    ---      Review of Systems    ---    _CPE FULL ROS_ :    no Visual change. no Hearing loss. no [name] symptoms. no Headaches. no Oral  lesions. no Shortness of breath. no Chest pain. no Palpitations. no Back Pain,  see hpi, chronic no new  deficts  . no Dysuria,  denies LUTS  . no Urinary  frequency or incontinence. no Diarrhea or constipation. no Rectal bleeding. no  Weakness or numbness. no Lightheadedness or dizziness. no Change in skin  lesions. no Edema. no Difficulty with speech or ambulation. no Arthralgias or  myalgias. no Significant weight loss or gain. no Mood changes. no Nausea or  vomiting. no Reflux symptoms. no Abdominal Pain.    _CARDIOLOGY_ :    no Chest Pain. no Shortness of Breath,  none  . no Palpitations. no Dizziness.  no Leg Edema. no Fatigue. no Orthopnea. no PND (paroxsymal nocturnal dyspnea).            Reason for Appointment    ---      1\. Needs Colo/Stool cards    ---    2\. Annual visit    ---      History of Present Illness    ---    _Interim History_ :    56 year old male, PMH as noted below, presents for annual exam. Meds as per  list. Last labs reviewed    \- [facility]    \- GErd 1-2x a week    \- mood better on lamictal..multiple labs ordered by Psych NP reviewed.    Sleep study-needs to book    Due for  Colonoscopy 2018, declind opted for stool cards    Shingrix/Tdap discsused ..he declined.      Vital Signs    ---    Wt 166, Ht 66, BP 130/82, BMI  26.79  , HR 56, O2 Sat 98%A.      Past Orders    ---    _Lab:Lipase Level (Order Date - 06/26/2016) (Collection Date - 06/26/2016)_    Result: normal    Lipase Lvl    201      73-393 - Units/L    Notes: Mansur,Tarina 06/26/2016 9:07:45 PM >    _Lab:Glomerular Filtration Rate (Estimated) (Order Date - 06/26/2016)  (Collection Date - 06/26/2016)_    Result: normal    Afn Amer GFR    >90      \- ml/min    Non-Afn Amer GFR    >90      \- ml/min    Notes: Mansur,Tarina 06/26/2016 9:07:40 PM >    _Lab:.Automated diff (Order Date - 06/26/2016) (Collection Date - 06/26/2016)_    Result: normal    Basophil Auto    1      0-1 - %    Neutro Auto    65      40-71 - %    Eos Auto    2      0-3 - %    Lymph Auto    23      22-40 - %    Mono Auto    9      0-11 - %    Notes: Mansur,Tarina 06/26/2016 9:07:22 PM >    _Lab:Amylase Level (Order Date - 06/26/2016) (Collection Date - 06/26/2016)_    Result: normal    Amylase Lvl    53      25-115 - Units/L    Notes: Mansur,Tarina 06/26/2016 9:08:00 PM >    _Lab:CBC w/ Differential (Order Date - 06/26/2016) (Collection Date -  06/26/2016)_    Result: normal    Hct    43.1      39.0-50.0 - %    Hgb    14.6  13.0-17.0 - Gm/dL    MCH    29      16-10 - pGm    MCHC    33.9      32.0-36.0 - Gm/dL    MCV    84      96-04 - fL    MPV    9.7      7.4-11.5 - fL    Platelet    225      150-400 - thous/mm3    RBC    5.11      4.20-5.40 - Mil/mm3    RDW    12.6      11.5-14.5 - %    WBC    8.9      3.6-10.5 - thous/mm3    Notes: Mansur,Tarina 06/26/2016 9:08:31 PM >    _Lab:Comprehensive Metabolic Panel (Order Date - 06/26/2016) (Collection Date  - 06/26/2016)_    Result: normal    Albumin Lvl    4.3      3.5-5.0 - Gm/dL    Alk Phos    58      45-117 - Units/L    ALT    26      6-78 - Units/L     AST    15      6-40 - Units/L    Bili Total    0.4      0.2-1.2 - mg/dL    BUN    16      5-40 - mg/dL    Calcium Lvl    9.2      8.5-10.1 - mg/dL    Chloride    981      98-110 - mmol/L    CO2    29      21-32 - mmol/L    Creatinine    0.941      0.550-1.300 - mg/dL    Glucose Lvl    83      65-110 - mg/dL    Potassium Lvl    4.6      3.6-5.2 - mmol/L    Sodium Lvl    136      136-145 - mmol/L    Total Protein    7.4      6.0-8.0 - Gm/dL    Anion Gap    6      3-11 -    Notes: Mansur,Tarina 06/26/2016 9:08:14 PM >    _Lab:Helicobacter Pylori Antigen (Order Date - 06/26/2016) (Collection Date -  06/27/2016)_    Result: negative    H Pylori Ag    Negative      Negative -    Notes: Mansur,Tarina 06/30/2016 7:00:57 AM >      Examination    ---    Denton Meek Examination_ :    General Appearance:  Well appearing, in NAD  .    Skin:  no abnormal lesions  .    Oral cavity:  clear  ,  moist mucus membranes  .    HEENT:  Head is atraumatic and normocephalic. Sclerae are anicteric.  Conjunctivae are not injected. Mucous membranes are moist, without oral  lesions. Nasal passages are clear. TMs are clear bilaterally  .    Neck, Thyroid :  Supple. Carotid upstroke is intact, without bruit, cervical  adenopathy, or thyromegaly noted  .    Heart:  Regular rate and rhythm, without murmur or click appreciated  .    Lungs:  clear to auscultation bilaterally  .  Abdomen:  Positive bowel sounds. Soft and nontender, without mass,  organomegaly, or bruit  .    Male Genitourinary:  deferred  .    Extremities:  No cyanosis, clubbing, or edema  .    Peripheral pulses:  Exam reveals all pulses to be intact, without bruit  .    Neurologic Exam:  non-focal exam  .          Assessments    ---    1\. Essential hypertension - I10 (Primary)    ---    2\. Screen for colon cancer - Z12.11 (Primary)    ---    3\. Dilated aortic root - I77.810    ---    4\. Depression with anxiety - F41.8    ---    5\. Routine  medical exam - Z00.00    ---    6\. Lumbar disc disease with radiculopathy - M51.16    ---      Treatment    ---      **1\. Essential hypertension**    _LAB: Basic Metabolic Panel_   BUN    17      7-18 - mg/dL    ------------    Calcium Lvl    9.7      8.5-10.1 - mg/dL    ------------    Chloride    101      98-110 - mmol/L    ------------    CO2    31      21-32 - mmol/L    ------------    Creatinine    1.080      0.550-1.30 - mg/dL    ------------    Glucose Lvl    106      65-110 - mg/dL    ------------    Potassium Lvl    4.7      3.6-5.2 - mmol/L    ------------    Sodium Lvl    139      136-145 - mmol/L    ------------    Anion Gap    7      3-11 -    ------------      This lab was reviewed by [name] on 09/11/2016 at 13:05 PM EDT    ------    _LAB: Urinalysis (Complete)_ UA Bacteria    None      None -    ------------    UA Bili    Negative      Negative -    ------------    UA Blood    Negative      Negative -    ------------    UA Clarity    Clear      Clear -    ------------    UA Color    Straw      Yellow -    ------------    UA Glucose    Negative      Negative -    ------------    UA Ketones    Negative      Negative -    ------------    UA Leuk Est    Negative      Negative -    ------------    UA Nitrite    Negative      Negative -    ------------    UA PH    6.0      5.0-8.0 -    ------------    UA Protein  Negative      Negative -    ------------    UA RBC    <1      0-3 - /HPF    ------------    UA Spec Grav    1.011      1.005-1.03 -    ------------    UA Squamous Epi    <1      0-5 - /HPF    ------------    UA Urobilinogen    Negative      Negative -    ------------    UA WBC    <1      0-5 - /HPF    ------------      This lab was reviewed by [name] on 09/11/2016 at 13:05 PM EDT    ------        Notes: Patient to follow  DASH , labs ordered , blood pressure at goal.        **2\. Screen for colon cancer**    _LAB: stool cards_     Dovie Kapusta 09/17/2017 8:48:17 AM > This lab was  reviewed by [name] on 09/17/2017 at 08:48 AM EDT    ------        Clinical Notes: discussed colon cancer screening ...he opted for stool cards  .Marland KitchenMarland KitchenUnderstands risks of colon cancer and that Colonoscopy testing despite few  risks involved still is the gold standard screening method.        **3\. Dilated aortic root**    _IMAGING: Consult_     Dequon Schnebly 09/17/2017 9:11:14 AM > This DI was  reviewed by [name] on 09/17/2017 at 09:11 AM EDT    ------        Clinical Notes: refer to cardiology for follow up....BP controlled...might  need beta blocker.        **4\. Depression with anxiety**    Clinical Notes: Pt seeing psych as well as therapist and on meds as above and  managing. encouarged continued complaince with OV and meds.        **5\. Routine medical exam**    _LAB: Lipid Panel_   Chol    210    H    140-200 - mg/dL    ------------    HDL    62    H    41-60 - mg/dL    ------------    LDL    134    H    0-129 - mg/dL    ------------    Trig    72      0-149 - mg/dL    ------------      This lab was reviewed by [name] on 09/11/2016 at 13:05 PM EDT    ------    _LAB: Prostate Specific Antigen Screen_ PSA    2.50      0.00-4.00 - nGm/ml    ------------      This lab was reviewed by [name] on 09/11/2016 at 13:05 PM EDT    ------        Clinical Notes: High fiber, low fat diet, nonimpact aerobic exercise.  Surveillance colonoscopy due declined Yearly flu shot.nTdap declined today, ,  Shingrix discussed patient to check coverage with insurance.        **6\. Lumbar disc disease with radiculopathy**    Clinical Notes: following provider in [place], visit coming up soon, no focal  neuro deficts.      Preventive Medicine    ---  Counseling:    Exercise .    Seatbelts .    Bike  helmet .    Skin Self Exams .    Sunscreen .    Testicular self exam .    ---      Follow Up    ---    6months bp TM 15 minutes, check andf 1 year MC cpe    Electronically signed by [name] , M.D. on 09/11/2016 at 12:58 PM EDT    Sign off status: Completed        * * *        [facility]    [address]    [place], [place] [address]    Tel: [phone]    Fax: [phone]              * * *          Patient: FAHED, MORTEN DOB: [birth year] Progress Note: [name],  M.D. 09/11/2016    ---    Note generated by eClinicalWorks EMR/PM Software ([email or web address])

## 2016-09-12 ENCOUNTER — Ambulatory Visit: Admitting: Cardiovascular Disease

## 2016-09-12 NOTE — Progress Notes (Signed)
* * *        **  [name]    --- ---    71 Y old Male, DOB: 1960-10-29    [address] [place], [place], [place] [address]    Home: [phone]    Provider: Earnestine Mealing, MD        * * *    Telephone Encounter    ---    Answered by   Scharlene Corn  Date: 09/12/2016         Time: 10:58 AM    Caller   JOAN PTS WIFE    --- ---            Reason   CONSULT            Message                      SOONER CONSULT DUE TO DX: DILATED AORTIC ROOT             [phone]                Action Taken   LaBranche,Pamela 09/18/2016 3:25:55 PM > LMM with a new appt on  7/2 at 2:00pm with Dr. Ann Maki -- pl                * * *                ---          * * *          Patient: Jordan [name], Jordan [name] DOB: 1960-11-21 Provider: Earnestine Mealing, MD  09/12/2016    ---    Note generated by eClinicalWorks EMR/PM Software ([email or web address])

## 2016-09-22 ENCOUNTER — Ambulatory Visit

## 2016-09-25 ENCOUNTER — Ambulatory Visit: Admitting: Cardiovascular Disease

## 2016-09-25 ENCOUNTER — Ambulatory Visit

## 2016-09-25 ENCOUNTER — Ambulatory Visit: Admitting: Otolaryngology

## 2016-09-25 NOTE — Progress Notes (Signed)
* * *        **  Jordan [name]    --- ---    69 Y old Male, DOB: Dec 08, 1960    [address] [place], [place], [place] [address]    Home: [phone]    Provider: Roosvelt Maser, MD        * * *    Telephone Encounter    ---    Answered by   Encarnacion Slates  Date: 09/25/2016         Time: 02:36 PM    Reason   Echo & Ann 09/2017    --- ---            Action Taken   [facility] 11/09/2016 10:11:52 AM > Booked Echo for 09/18/17  at 7:30am, ANN w/Dr. Alvino Chapel booked for 10/03/17 at 4:15pm, s/w patient wife                * * *                ---          * * *          Patient: Jordan [name], Jordan [name] DOB: 1961-01-31 Provider: Roosvelt Maser, MD  09/25/2016    ---    Note generated by eClinicalWorks EMR/PM Software ([email or web address])

## 2016-09-25 NOTE — Progress Notes (Signed)
* * *         [facility]**        ---    Lawernce Ion. Sindy Messing, MD [facility] Kieth Brightly. Donnetta Simpers, MD [facility] Barbera Setters Byrd Hesselbach, MD  [facility];    Darlyn Chamber, MD [facility] Roosvelt Maser, MD [facility] Shanon Brow, MD [facility]  Nile Dear. Audley Hose, MD [facility]    Kandis Mannan A. Karie Mainland, MD [facility] Johnell Comings. MacNaught, MD [facility] Mitchel Honour, MD Erma Pinto, MD [facility]    Colletta Maryland, MD Weston Anna, MD [facility] Kerby Moors, NP Maximino Greenland ,  NP        * * *     **Patient Name:** Jordan [name]   **Date:** 09/25/2016    --- ---     **DOB:** 1960/10/22     **Referring Provider:** [name]   **Appointment Provider:** Roosvelt Maser, MD        * * *    09/25/2016  Progress Notes: Roosvelt Maser, MD    --- ---    ---        Current Medications    ---    Taking     * lisinopril 20 mg tablet 1 tab(s) orally once a day    ---    * Prozac 20 mg capsule 1 cap(s) orally once a day    ---    * lamotrigine 25 mg tablet 1 tab(s) orally 2 times a day    ---    * Klonopin 0.5 mg tablet 1 tab(s) orally 3 times a day    ---    * B-12 1000 mcg tablet 1 tab(s) sublingually once a day    ---    * Vitamin D3 2000 intl units tablet 1 tab(s) orally once a day    ---    * Medication List reviewed and reconciled with the patient    ---      Past Medical History    ---      Dilated aorta    ---    ..4/18: Echo 4cm    ---    HTN    ---    Depression/anxiety sees Psych    ---      Family History    ---      Father: deceased, died CHF/HTN, diagnosed with Hypertension, Heart Disease    ---    Mother: alive, HTN, diagnosed with Hypertension    ---      Social History    ---    no Tobacco Status: **never smoked**.    Advised to lose weight: Yes .    No Have you fallen in the past year?.    no ETOH.    Occupation: Curator on a golf course.      Allergies    ---      N.K.D.A.    ---        Reason for Appointment    ---      1\. Booked by pts wife per Dr. Ruffin Frederick PCP Dx: Dilated aortic root;wanted  sooner;made sooner, lmm    ---       History of Present Illness    ---     _History of Present Illness_ :    I was kindly asked to see the patient for Dr. Ruffin Frederick in regards to a dilated  thoracic aorta measuring 4cm on Echo 4/18    He states his BP has been relatively well  controlled until over the past 6-9  months when he has been dealing with depression and anxiety. He feels that  after starting lamotrigine 100 mg daily his blood pressure was more difficult  to control. His blood pressure readings were up into the 170s at times. His  lamotrigine was decreased down to 50 mg and his BP has been better controlled  although he still notes occasional readings into the 150+ range although  states most of the readings are in the 110-120s.    However he does endorse some degree of noncompliance and forgets to take his  lisinopril 20 mg on a consistent basis. He acknowledges this as a problem.    He never has chest discomfort and is active as a Curator on a golf course.    He does have significant back pain but does not take a significant number of  nonsteroidals due to injections he has been receiving. If he has significant  pain he typically takes Tylenol. He does not drink nor has he gained a  significant amount of weight recently.    Because of his difficult to control BP and echocardiogram was performed  demonstrating a mildly dilated ascending aorta at 4 cm.    EKG personally reviewed on 09/25/16 showing NSR with non-specific ST changes    ROS: no sob, chest pain, palpitations, PND, edema, new focal weakness or  slurring of speech, N/v/d, hemoptysis, fevers/chills, rashes, dramatic weight  loss, abdominal pain, polyuria. All other systems reviewed and were negative.      Vital Signs    ---    HR 61, BP 140/72, Wt 166, Ht 5'7, BMI 32.42, Med Assist: em.      Examination    ---     _.._ :    General Appearance Well developed and nourished, No Acute distress, Looks  stated age . Eyes Sclera are anicteric, Conjunctiva Pink, Pupils equal and  Round. ENMT  Atraumatic, normocephalic, Dentition good, Trachea is midline.  Neck No JVD, 2+ carotids without bruits. Heart Exam S1S2 normal, RRR, PMI non-  displaced, No S3, No S4. Murmurs No significant murmurs. Pulmonary Clear to  ausculatation bilaterally, No respiratory Distress;. GI system soft, non-  tender, positive bowel sounds, No hepatosplenomegaly, No bruits, No pulsatile  masses. Extremities No edema bilaterally. Neurology Grossly non-focal. Skin  intact without rashes or bruises, warm and dry. Pyschiatric appropriate, Alert  and oriented x 3. Peripheral pulses intact throughout.          Impression/Recommendations    ---       **1\. Aortic dilatation**    _IMAGING: **Echocardiogram (Ordered for 09/25/2017)_    Clinical Notes: Minimally dilated aorta and ascending of hypertension.  Reviewed findings and explained risk for dissection at this point is low but  he should have repeat echocardiograms once a year to monitor his aortic size.  At a diameter of 5.5 cm risk for rupture will be elevated and he would benefit  from ascending thoracic aorta repair. I reviewed the importance of blood  pressure control to minimize the risk for further aortic dilatation.    Currently his BP is not optimal although this may in large part be due to  intermittent compliance. He does not wish to increase his lisinopril at this  time and wants to work on getting his compliance which we discussed at length.  This seems more than reasonable. He will call me if he notes persistently  elevated readings.    I will plan to  see him back in one year with repeat echocardiogram at that  time.After 2 or 3 annual echocardiograms, if his aortic diameter has not  changed significantly we will plan to see him every other year unless he needs  to be seen sooner for stricter BP control.    ---        Diagnostic Imaging    --- ---    Ardine Bjork: **EKG_    ---       Procedure Codes    ---      93000 IH    ---      Follow Up    ---    1 Year    Electronically  signed by Lowella Fairy MD on 09/25/2016 at 02:40 PM EDT    Sign off status: Completed        * * *        [facility]    [address] RESEARCH [address]    [place], [place] [address]    Tel: [phone]    Fax: [phone]              * * *          Patient: SEVAN, MCBROOM DOB: [birth year] Progress Note: Roosvelt Maser, MD  09/25/2016    ---    Note generated by eClinicalWorks EMR/PM Software ([email or web address])

## 2016-09-25 NOTE — Progress Notes (Signed)
* * *        Jordan [name], Phat**    ------    13 Y old Male, DOB: 1960-12-02    Account Number: [id number]    [address] [place], [id number]    Home: [phone]    Guarantor: Jordan [name] Insurance: [employer] BC/BS PPO Payer ID: [id number]    PCP: [facility] Referring: [facility]    Appointment Facility: [facility]        * * *    09/25/2016    NH New: Jordan [name]    ------    ---        **Current Medications**    ---    Taking      * lisinopril     ---    * Prozac     ---    * lamotrigine     ---    Jordan [name]     ---    * Medication List reviewed and reconciled with the patient     ---      **Past Medical History**    ---      HTN    ---    Depression    ---      **Surgical History**    ---      No Surgical History documented.    ---      **Family History**    ---      Father: unknown, diagnosed with Unk Fam    ---    HTN, kidney disease, sleep apnea, thyroid disease.    ---      **Social History**    ---    no Smoking  Are you a: nonsmoker.    no Recreational drug use.    Alcohol: yes, occasional.      **Allergies**    ---      N.K.D.A.    ---      **Hospitalization/Major Diagnostic Procedure**    ---      No Hospitalization History.    ---      **Review of Systems**    ---    _A detailed ROS was performed and is negative except for positives noted  below: (ROS sheet in office chart)._ :    Loss of hearing Yes. Ringing in ears/tinnitus yes. Exposure to loud noise yes.  Loss of sense of smell yes. Heartburn/indigestion yes.            **Reason for Appointment**    ---      1\. Hearing loss    ---      **History of Present Illness**    ---    __ :    56 year old male here for evaluation of ear ringing. He has noticed that it is  in both ears, high pitched, does not pulsate, sounds like humming  transformers, has been present for many years. 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 No ear surgeries.    He has reported reduced  smell and taste over the past six months. He feels  that it suddenly occurred. He has low back disc issues and has anxiety and  depression. He had a nervous breakdown six months ago and was placed on  lamotrigine. He is also on prozac and klonopin. He is being evaluated by a  psychiatrist.    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 He will take claritin as needed.  No nasal congestion or drainage now. No facial pressure.    Patient denies otalgia, otorrhea, aural fullness or pressure, autophony,  hearing changes, tinnitus, vertigo, or facial numbness or weakness.    07/12/16 CT Brain without contrast: mild cerebellar tonsillar ectopica, mild  mucosal thickening in the paranasal sinuses. No acute intracranial pathology.    09/25/16 Audiogram: bilateral normal to moderate SNHL, right worse than left at  [facility] only, 100% word discrimination, type A tympanogram bilaterally.      **Vital Signs**    ---    Ht 67, Wt 165, BMI  25.84  , BP 145/76, HR 59    Referred follow-up on BMI back to PCP, BP handout given.      **Examination**    ---    _Constitutional_ :    General Appearance:  No apparent distress  .    _Orbits_ :    EOM's  intact,  no spontaneous nystagmus  .    _Ear_ :    Right:  Pinna without lesions. External auditory canal is clear without edema  or erythema. The tympanic membrane is intact without evidence of middle ear  effusion  . Left:  Pinna without lesions. External auditory canal is clear  without edema or erythema. The tympanic membrane is intact without evidence of  middle ear effusion  . Facial nerve:  Facial Nerve is intact and symmetric.  .  Tuning forks  Right: 512 Hz tuning fork used. Air>Bone conduction. Weber is  midline.Left: 512 Hz tuning fork used. Air>Bone conduction.  .    _Nose_ :    Speculum exam:  anterior nasal cavity is clear. The caudal septal mucosa is  without lesions and midline. The anterior face of the bilateral inferior  turbinates is unremarkable  .    _Oral  cavity/Oropharynx_ :    Lips:  No lesions  . Teeth:  intact dentition  . FOM:  without lesions  .  Tongue:  symmetric movement, no masses.  . Palate:  no masses  . Oropharynx:  posterior oropharyngeal wall mucosa without lesions  . Oral vestibule:  no  lesions  .    _Larynx/Hypopharynx_ :    voice  No hoarseness  .    _Neck_ :    Palpation:  No masses, adenopathy  .    _Skin of Head/Neck_ :    :  no obvious lesions  .    _Cranial Nerve assessment_ :    Nerves 3 - 12:  symmetric bilaterally  .          **Assessments**    ---    1\. Bilateral tinnitus - H93.13 (Primary), 56 year old male here for  evaluation of bilateral tinnitus, question of hearing loss, reduced sense of  smell, here for evaluation. His exam is overall unremarkable. CT Brain from  [facility] is unremarkable. Audiogram shows bilateral high frequency sensorineural  hearing loss. I reviewed options and recommended trial of flonase nasal  steroid spray for 2 months. If symptoms persist, then can consider MRI Brain  to rule out any lesions not seen on CT imaging. He will call me with an  update. Otherwise I will see him back in 1 year for repeat audiogram. I  discussed the pathophysiology of subjective tinnitus which is the brain's  response to peripheral auditory system input deprivation. Causes of tinnitus  include certain medications (aspirin, NSAID, diuretics, chemotherapeutics),  neoplasm (vascular tumors, retrocochlear pathology), and can be associated  with smoking, reduced sleep, stress, depression, noise  exposure, thyroid  disease. I discussed treatment paradigm for tinnitus including masking sounds,  tinnitus re-training, hearing amplification devices which can help some  people, and rarely anti-depressants or anxiolytic medications which would  warrant psychiatric evaluation prior to prescription. Patient will try masking  at this time.    ---    2\. Reduced sense of smell - R43.8    ---    3\. 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 Others**    Notes: Patient feels comfortable with the plan as outlined above. All  questions and concerns were addressed in full detail. I will be contacted if  there are any persistent, worsening, recurrent or new problems or concerns.  Potential worrisome issues/symptoms reviewed. It was a pleasure to meet with  this patient today.    ---      **Procedure Codes**    ---      34742 CAE    ---    59563 IMPEDANCE    ---      **Follow Up**    ---    1 Year with audiogram    Electronically signed by Jordan [name] , MD on 09/25/2016 at 09:04 AM EDT    Sign off status: Completed        * * *        [facility]    [address]    [place], [place] [address]    Tel: [phone]    Fax: [phone]              * * *          Patient: Jordan [name], Jordan [name] DOB: 24-Oct-1960 Progress Note: Jordan [name]  09/25/2016    ---    Note generated by eClinicalWorks EMR/PM Software ([email or web address])

## 2016-09-26 ENCOUNTER — Ambulatory Visit

## 2016-09-26 ENCOUNTER — Ambulatory Visit: Admitting: Internal Medicine

## 2016-09-28 ENCOUNTER — Ambulatory Visit

## 2016-10-15 ENCOUNTER — Ambulatory Visit: Admitting: Internal Medicine

## 2016-10-19 ENCOUNTER — Ambulatory Visit: Admitting: Otolaryngology

## 2016-10-19 NOTE — Progress Notes (Signed)
* * *        **  Jordan [name], Jordan [name]**    ------    65 Y old Male, DOB: [birth year]    [address] [place], [place], [place] [address]    Home: [phone]    Provider: Gerhard Munch        * * *    Web Encounter    ---    Answered by    Legrand Pitts    Date: 10/19/2016        Time: 09:10 AM    Reason    Release of Information    ------            Message                      Record release in pt chart okay to release/rn                Action Taken    Rolen Conger R 10/19/2016 10:52:53 AM > Yes can release,  thanks. Ladebauche,Paulette 10/19/2016 11:56:06 AM > Note faxed/PL                * * *                ---          * * *          Patient: Jordan [name], Jordan [name] DOB: 13-Dec-1960 Provider: [name] R  10/19/2016    ---    Note generated by eClinicalWorks EMR/PM Software ([email or web address])

## 2016-11-16 ENCOUNTER — Ambulatory Visit: Admitting: Internal Medicine

## 2016-12-14 ENCOUNTER — Ambulatory Visit: Admitting: Internal Medicine

## 2016-12-14 NOTE — Progress Notes (Signed)
* * *        **Dutta, Jais M**    ------    18 Y old Male, DOB: 01/29/1961    [address], [place], [place], [place] [address]    Home: [phone]    Provider: [name]        * * *    Web Encounter    ---    Answered by    Blenda Nicely    Date: 12/14/2016        Time: 07:39 PM    Caller    Deverick Tietze    ------            Reason    Sleep test            Message                       Addressed To: Quincy Prisco        <br/> Hello,      my psychiatric  got the results from my sleep test and said that  I should follow up with you.  Do you have any concerns from the test result that we need to discuss?      Thank you,      [name]                Action Taken    [facility] 12/15/2016 7:52:05 AM > Rae Halsted 12/15/2016 8:48:19 AM > having front scan to you, thanks  Lindell Tussey 12/15/2016 11:35:55 AM > Hi Aleksei, we are trying to retrieve  as we did not receive from sleep lab. Thanks for reaching out. Dr Deloris Ping 12/18/2016 9:09:27 AM > Please let him know he does have sleep  apnea, we do recommend moving forward with sleep mask CPAP for daily use we  will need to see him 4 weeks after he has been using machine to see if sleep  has improved....we will send scripts and someone from the sleep company should  contact him, they will fit him for mask etc...tks Rae Halsted  12/18/2016 5:14:09 PM > Hi Habeeb- After speaking with Dr. Ruffin Frederick she states  that you do have sleep apnea and would benefit from the Cpap machine and mask-  We will send a script over to the company to get you set up. they should be  contacting you, thanks Manton- sent message                * * *        **eMessages**   From:    Courtnee Myer    ------    Created:    2016-12-15 11:36:32.0    Sent:    2016-12-15 11:36:48.0    Subject:    BJ:YNWGN test    Message:                      [name]  12/15/2016 11:35:55 AM > Hi Caitlin, we are trying to retrieve results   as we did not receive from sleep lab. Thanks for reaching out. Dr Ruffin Frederick                    * * *            From:    Daren Yeagle    Created:    2016-12-18 09:10:58.0    Sent:    2016-12-18 09:12:03.0    Subject:    FA:OZHYQ test  Message:                      Florence Yeung  12/18/2016 9:09:27 AM > Please let him know he does have sleep apnea, we do recommend moving forward with sleep mask CPAP for daily use we will need to see him 4 weeks after he has been using machine to see if sleep has improved....we will send scripts and someone from the sleep company should contact him, they will fit him for mask etc...tks                    * * *            From:    Rae Halsted    Created:    2016-12-18 17:18:04.0    Sent:      Subject:    VH:QIONG test    Message:                      Rae Halsted  12/18/2016 5:14:09 PM > Hi Sony- After speaking with Dr. Ruffin Frederick she states that you do have sleep apnea and would benefit from the Cpap machine and mask- We will send a script over to the company to get you set up. they should be contacting you, thanks Beth                        ---          * * *          Patient: Jordan [name], Jordan [name] DOB: 1960/07/04 Provider: [name]  12/14/2016    ---    Note generated by eClinicalWorks EMR/PM Software ([email or web address])

## 2016-12-15 ENCOUNTER — Ambulatory Visit: Admitting: Internal Medicine

## 2016-12-15 NOTE — Progress Notes (Signed)
* * *        **  [name], [name]**    ------    16 Y old Male, DOB: 30-Oct-1960    [address] [place], [place], [place] [address]    Home: [phone]    Provider: [name]        * * *    Web Encounter    ---    Answered by    [name]    Date: 12/15/2016        Time: 11:35 AM    Message                      see previous TE....I never got results of sleep test..[name]? what is happening? Can we call their manager...they are not sending results back at all...this was done almost 3 months ago...        ------            Action Taken    Flora Parks 12/15/2016 11:35:06 AM > Alves,Pam 12/15/2016  11:36:23 AM > See previous TE Rae Halsted 12/15/2016 11:38:34 AM >  Hi Jes- I pulled the results from [facility] this morngin regarding  your sleep study, results have been scanned to Dr. Ruffin Frederick, once I hear from  her, we will contact you with the results, thanks beth- sent message                * * *        **eMessages**   From:    Rae Halsted    ------    Created:    2016-12-15 11:39:27.0    Sent:      Subject:    RE:    Message:                      Rae Halsted  12/15/2016 11:38:34 AM > Hi Beulah- I pulled the results from [facility] this morngin regarding your sleep study, results have been scanned to Dr. Ruffin Frederick, once I hear from her, we will contact you with the results, thanks beth                        ---          * * *          Patient: Jordan [name], Jordan [name] DOB: 1960-12-24 Provider: [name]  12/15/2016    ---    Note generated by eClinicalWorks EMR/PM Software ([email or web address])

## 2016-12-20 ENCOUNTER — Ambulatory Visit: Admitting: Internal Medicine

## 2016-12-20 NOTE — Progress Notes (Signed)
* * *        **  Jordan [name], Jordan [name]**    ------    52 Y old Male, DOB: 1960/12/23    [address], [place], [place], [place] [address]    Home: [phone]    Provider: [name]        * * *    Web Encounter    ---    Answered by    Blenda Nicely    Date: 12/20/2016        Time: 09:06 PM    Caller    Hjalmar Bjelland    ------            Reason    Re:RE:Sleep test            Message                      Thank you Beth, but I really do not feel that this is what I need right now.  My problem is falling asleep not staying asleep.  I did read that one of the side affects from the newer medication they put me on (Lamotrigine) is trouble sleeping (insomnia).  I want to discuss this with my Psychiatrists as I feel my problem is medication related and not sleep apnea.              [name]      ....................................................................-      To :[name]      From :[facility]      Sent :12/18/2016 05:18 PM      Subject :QI:ONGEX test             Rae Halsted  12/18/2016 5:14:09 PM > Hi Indigo- After speaking with Dr. Ruffin Frederick she states that you do have sleep apnea and would benefit from the Cpap machine and mask- We will send a script over to the company to get you set up. they should be contacting you, thanks Beth                            Action Taken    [facility] 12/21/2016 8:36:39 AM > Rae Halsted 12/21/2016 9:34:01 AM > Fernandez Kenley 12/21/2016 1:13:36 PM >                * * *                ---          * * *          Patient: Jordan [name], Jordan [name] DOB: 17-Jun-1960 Provider: [name]  12/20/2016    ---    Note generated by eClinicalWorks EMR/PM Software ([email or web address])

## 2017-01-25 ENCOUNTER — Ambulatory Visit: Admitting: Medical

## 2017-03-09 ENCOUNTER — Ambulatory Visit: Admitting: Internal Medicine

## 2017-03-09 NOTE — Progress Notes (Signed)
* * *        **  Jordan [name], Jordan [name] [name]**    ------    80 Y old Male, DOB: [birth year]    [address] [place], [place], [place] [address]    Home: [phone]    Provider: [name]        * * *    Web Encounter    ---    Answered by    Shaune Leeks    Date: 03/09/2017        Time: 05:11 PM    Caller    [name]    ------            Reason    Cancel Appointment Request            Message                      Cancel Appointment:      Date: 03/13/2017  Time: 8:00 AM       Reason: bp check      Provider: Consuelo Pandy       Facility:[facility]       ------------------------------------------       Reason For Cancellation: I have been checking my pressure at my Phy appointments                Action Taken                      Tilton,Shannon  03/09/2017 5:18:13 PM > appointment cancelled. De Hollingshead      Mansur,Tarina  03/10/2017 8:18:12 AM >                     * * *                ---          * * *          Patient: Jordan [name], Jordan [name] [name] DOB: June 19, 1960 Provider: [name]  03/09/2017    ---    Note generated by eClinicalWorks EMR/PM Software ([email or web address])

## 2017-05-25 ENCOUNTER — Ambulatory Visit: Admitting: Internal Medicine

## 2017-05-25 NOTE — Progress Notes (Signed)
* * *        **  [name], Jordan**    ------    39 Y old Male, DOB: 07-15-1960    [address], [place], [place], [place] [address]    Home: [phone]    Provider: [name]        * * *    Telephone Encounter    ---    Answered by    Park Liter    Date: 05/25/2017        Time: 07:36 AM    Caller    [facility]    ------            Reason    Refill            Message                      Pt needs a refill for Lisinopril-HCTZ 90 day supply.                Action Taken                      Pinto,Lillian  05/25/2017 7:45:49 AM > script sent                 Refills    Refill hydrochlorothiazide-lisinopril tablet, 25 mg-20 mg, orally,  90, 1 tab(s), once a day, 90 days, Refills=1    ------          * * *                ---          * * *          Patient: [name], [name] DOB: [birth year] Provider: [name]  05/25/2017    ---    Note generated by eClinicalWorks EMR/PM Software ([email or web address])

## 2017-09-14 ENCOUNTER — Ambulatory Visit

## 2017-09-17 ENCOUNTER — Ambulatory Visit: Admitting: Internal Medicine

## 2017-09-17 LAB — HX BASIC METABOLIC PANEL
CASE NUMBER: 2019175000932
HX ANION GAP: 6 (ref 3.0–11.0)
HX BUN: 13 mg/dL (ref 6.0–20.0)
HX CALCIUM LVL: 9.5 mg/dL (ref 8.5–10.5)
HX CHLORIDE: 99 mmol/L (ref 98.0–110.0)
HX CO2: 31 mmol/L (ref 21.0–32.0)
HX CREATININE: 0.916 mg/dL (ref 0.55–1.3)
HX GLUCOSE LVL: 103 mg/dL (ref 70.0–110.0)
HX POTASSIUM LVL: 4.3 mmol/L (ref 3.6–5.2)
HX SODIUM LVL: 136 mmol/L (ref 136.0–146.0)

## 2017-09-17 LAB — HX URINALYSIS (COMPLETE)
CASE NUMBER: 2019175000933
HX UA BILIRUBIN: NEGATIVE
HX UA BLOOD: NEGATIVE
HX UA GLUCOSE: NEGATIVE
HX UA KETONES: NEGATIVE
HX UA LEUKOCYTE ESTERASE: NEGATIVE
HX UA NITRITE: NEGATIVE
HX UA PH: 6 (ref 5.0–8.0)
HX UA PROTEIN: NEGATIVE
HX UA RBC: 1 /HPF (ref 0.0–2.0)
HX UA SPECIFIC GRAVITY: 1.014 (ref 1.003–1.03)
HX UA SQUAMOUS EPITHELIAL: <1 (ref 0.0–5.0)
HX UA UROBILINOGEN: NEGATIVE
HX UA WBC: 1 /HPF (ref 0.0–5.0)

## 2017-09-17 LAB — HX GLOMERULAR FILTRATION RATE (ESTIMATED)
CASE NUMBER: 2019175000932
HX AFN AMER GLOMERULAR FILTRATION RATE: >90
HX NON-AFN AMER GLOMERULAR FILTRATION RATE: >90

## 2017-09-17 LAB — HX PROSTATE SPECIFIC ANTIGEN SCREEN
CASE NUMBER: 2019175000932
HX PSA: 2.03 ng/mL (ref 0.0–4.0)

## 2017-09-17 LAB — HX LIPID PANEL
CASE NUMBER: 2019175000932
HX CHOL: 190 mg/dL
HX HDL: 62 mg/dL
HX LDL: 114 mg/dL
HX TRIG: 69 mg/dL

## 2017-09-17 NOTE — Progress Notes (Signed)
* * *        **Jordan [name]**    ------    57 Y old Male, DOB: 08-Mar-1961    Account Number: [id number]    [address] [place], [place]    Home: [phone]    Guarantor: [name] Insurance: [employer] [place], [place] Payer ID:  [id number]    Appointment Facility: [facility]        * * *    09/17/2017    Progress Note: [name], [name]    ------    ---        **Current Medications**    ---    Taking      * lamotrigine 25 mg tablet 2 tab(s) orally Once a day     ---    * Vitamin D3 2000 intl units tablet 2 cap(s) orally once a day     ---    * Anusol-HC 25 mg suppository 1 SUPP(s) rectally 2 times a day     ---    * clonazepam 0.5 mg tablet 1 tab(s) orally every other day     ---    * hydrochlorothiazide-lisinopril 25 mg-20 mg tablet 1 tab(s) orally once a day     ---    * fluoxetine 10 mg capsule 1 cap(s) orally once a day     ---    Discontinued    * Fluoxetine Hydrochloride 40 mg capsule 1 cap(s) orally once a day     ---    * Medication List reviewed and reconciled with the patient     ---      Past Medical History    ---      L2-3 R-side disc bulge and osteophyte- Spinal Stenosis he has been  Injection stem cells, trying to avoid surgery. [name]        ---    Depression and Anxiety.        ---    HTN.        ---    Colonoscopy 2008 Dr [name] repeat in 2018 no polyps.        ---    2014 Elevated PSA Dr [name]. Status post biopsy.        ---    Insomnia anxiety related.        ---    Allergic rhinitis.        ---    Hx of H pylori.        ---    [facility] Mardee Postin NP; [name] therapist 1  x per month.        ---    Echo- normal function. EF 65 %. mildly dilated ascending aorta... repeat in 6  months.[name]        ---    CT brain mucosal thickening of paranasal sinus and mild tonsillary ectopia.  pending neuro consult 08/14/16.        ---    2017 Hep C Screen.. Negative.        ---      **Surgical History**    ---      Prostate  Biopsy negative 2014    ---      **Family History**    ---      Father: deceased, HTN, Kidney failure, Dyalsis, Heart failure, diagnosed  with Heart Disease, Hypertension    ---    Mother: alive 52 yrs, HTN, [place] , Hypertension    ---  Daughter 1: alive 70 yrs    ---    Paternal Grand Father: deceased    ---    Paternal Grand Mother: deceased 58 yrs    ---    Maternal Grand Father: deceased, CHF, Heart Disease    ---    Maternal Grand Mother: deceased, cancer at age 18, Cancer    ---    1 sister(s) - healthy. 1 son(s) , 2 daughter(s) - healthy.    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    ---      **Social History**    ---    Tobacco Use  Are you a? **Never smoker** , Patient was counseled on the  dangers of tobacco use: **09/16/2017** .    Alcohol (TEXT ONLY): socially.    no Drug use.    Marital Status: married, 34 yrs married.    Children: sons:1 daughters:2.    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    no Exercise, should be doing more .    no Caffeine, due to high blood pressure.    Sexually active Had sex in the last 12 months (vaginal,anal,oral): Yes, with:  Women only, Use Protection?: No, Prevention strategies discussed: Other, Have  you ever had an STD?: No.    Depression Screening I  Little interest or pleasure in doing things **Not at  all** , Feeling down, depressed, or hopeless **Not at all** , Trouble falling  or staying asleep, or sleeping too much **Several days** , Feeling tired or  having little energy **Not at all** , Poor appetite or overeating **Not at  all** , Feeling bad about yourself - or that you are a failure or have let  yourself or your family down **Not at all** , Trouble concentrating on things,  such as reading the newspaper or watching television **Not at all** , Moving  or speaking so slowly that other people could have noticed. Or the opposite-  being so figety or resltess that you have been moving around a lot more than  usual **Not  at all** , Thoughts that you would be better off dead, or of  hurting yourself **Not at all** , Total Score **1** , Interpretation **Minimal  Depression** .    Depression Screening II  If you checked off any problems, how difficult have  these problems made it for you to do your work, take care of of things at  home, or get along with other people **Not difficult at all** .      **Allergies**    ---      Tdap injection: swelling - Side Effects    ---    Cymbalta: constipation - Side Effects    ---      **Hospitalization/Major Diagnostic Procedure**    ---      Past Hospitalization Voluntary 3 days    ---      **Review of Systems**    ---    _CPE FULL ROS_ :    no Visual change,  advised yearly eye exams  . no Hearing loss. no [name]  symptoms. no Headaches. no Oral lesions. no Shortness of breath. no Chest  pain. no Palpitations. no Back Pain,  improved  . no Dysuria,  denies LUTS  .  no Urinary frequency or incontinence. no Diarrhea or constipation. no Rectal  bleeding. no Weakness or numbness. no Lightheadedness or dizziness. no Change  in skin lesions. no Edema. no Difficulty with speech  or ambulation. no  Arthralgias or myalgias. no Significant weight loss or gain. no Mood changes.  no Nausea or vomiting. no Reflux symptoms. no Abdominal Pain.    _CARDIOLOGY_ :    no Chest Pain. no Shortness of Breath,  none  . no Palpitations. no Dizziness.  no Leg Edema. no Fatigue. no Orthopnea. no PND (paroxsymal nocturnal dyspnea).            **Reason for Appointment**    ---      1\. Annual visit    ---      **History of Present Illness**    ---    _Interim History_ :    57 year old male, PMH as noted below, presents for annual exam. Meds as per  list. Stool cards not done. BP at home low, Overall mood better. Back better  no issues since injections in [place].    \+ sleep apnea, declined machine...discussed long term side effects    Aortic root dialted...follow up with DR Alvino Chapel tomorrow    \+ breathign stable epak flow  400.    Due for Colonoscopy/Colocards?    Shingrix/Tdap vac- no immunization in file.      **Vital Signs**    ---    Wt 167, Ht 66, BP  138/90  , **Repeat:130/80** , BMI  26.95  , HR 64, RR 12,  Peak Flow 400, O2 Sat 98%RA.      **Past Orders**    ---    _Lab:Glomerular Filtration Rate (Estimated) (Order Date - 09/11/2016)  (Collection Date - 09/11/2016)_    Result: Normal    Afn Amer GFR    88      \- ml/min    Non-Afn Amer GFR    76      \- ml/min    _Lab:Basic Metabolic Panel (Order Date - 09/11/2016) (Collection Date -  09/11/2016)_    Result: Normal    BUN    17      7-18 - mg/dL    Calcium Lvl    9.7      8.5-10.1 - mg/dL    Chloride    161      98-110 - mmol/L    CO2    31      21-32 - mmol/L    Creatinine    1.080      0.550-1.30 - mg/dL    Glucose Lvl    096      65-110 - mg/dL    Potassium Lvl    4.7      3.6-5.2 - mmol/L    Sodium Lvl    139      136-145 - mmol/L    Anion Gap    7      3-11 -    _Lab:Lipid Panel (Order Date - 09/11/2016) (Collection Date - 09/11/2016)_    Result: Normal    Chol    210    H    140-200 - mg/dL    HDL    62    H    04-54 - mg/dL    LDL    098    H    1-191 - mg/dL    Trig    72      4-782 - mg/dL    _Lab:Prostate Specific Antigen Screen (Order Date - 09/11/2016) (Collection  Date - 09/11/2016)_    Result: Normal    PSA    2.50      0.00-4.00 - nGm/ml  _Lab:Urinalysis (Complete) (Order Date - 09/11/2016) (Collection Date -  09/11/2016)_    Result: Normal    UA Bacteria    None      None -    UA Bili    Negative      Negative -    UA Blood    Negative      Negative -    UA Clarity    Clear      Clear -    UA Color    Straw      Yellow -    UA Glucose    Negative      Negative -    UA Ketones    Negative      Negative -    UA Leuk Est    Negative      Negative -    UA Nitrite    Negative      Negative -    UA PH    6.0      5.0-8.0 -    UA Protein    Negative      Negative -    UA RBC    <1       0-3 - /HPF    UA Spec Grav    1.011      1.005-1.03 -    UA Squamous Epi    <1      0-5 - /HPF    UA Urobilinogen    Negative      Negative -    UA WBC    <1      0-5 - /HPF      **Examination**    ---    _General Examination_ :    General Appearance:  Well appearing, in NAD  .    Skin:  no abnormal lesions  .    Oral cavity:  clear  ,  moist mucus membranes  .    HEENT:  Head is atraumatic and normocephalic. Sclerae are anicteric.  Conjunctivae are not injected. Mucous membranes are moist, without oral  lesions. Nasal passages are clear. TMs are clear bilaterally  .    Neck, Thyroid :  Supple. Carotid upstroke is intact, without bruit, cervical  adenopathy, or thyromegaly noted  .    Heart:  Regular rate and rhythm, without murmur or click appreciated  .    Lungs:  clear to auscultation bilaterally  .    Abdomen:  Positive bowel sounds. Soft and nontender, without mass,  organomegaly, or bruit  .    Male Genitourinary:  mild enlargement of prostate, no nodules  .    Extremities:  No cyanosis, clubbing, or edema  .    Peripheral pulses:  Exam reveals all pulses to be intact, without bruit  .    Neurologic Exam:  non-focal exam  .          **Assessments**    ---    1\. Routine medical exam - Z00.00 (Primary)    ---    2\. Screen for colon cancer - Z12.11    ---    3\. Has not received influenza vaccination in current influenza season - Z28.9    ---    4\. Sleep apnea - G47.30    ---    5\. Dilated aortic root - I77.810    ---    6\. Elevated PSA - R97.20    ---    7\. Depression with anxiety - F41.8    ---      **  Treatment**    ---      **1\. Routine medical exam**    _LAB: Basic Metabolic Panel_ Normal    Creatinine    0.916      0.550-1.300 - mg/dL    ------------    Sodium Lvl    136      136-146 - mmol/L    ------------    Potassium Lvl    4.3      3.6-5.2 - mmol/L    ------------    Chloride    99      98-110 - mmol/L    ------------    CO2    31      21-32 -  mmol/L    ------------    Calcium Lvl    9.5      8.5-10.5 - mg/dL    ------------    Glucose Lvl    103      70-110 - mg/dL    ------------    Anion Gap    6      3-11 -    ------------    BUN    13      6-20 - mg/dL    ------------      [facility] 09/17/2017 2:20:49 PM >    ------    _LAB: Lipid Panel_ Normal  Chol    190      <=200 - mg/dL    ------------    HDL    62      >=40 - mg/dL    ------------    Trig    69      <=150 - mg/dL    ------------    LDL    114      <=130 - mg/dL    ------------      Mendel Binsfeld 09/17/2017 2:20:49 PM >    ------        Clinical Notes: High fiber, low fat diet, nonimpact aerobic exercise.  Surveillance colonoscopy is due....he agreed to stool cards . Yearly flu shoty  Shingrix discussed patient to check coverage with insurance.        **2\. Screen for colon cancer**    _LAB: stool cards_     Clements,Maria 10/25/2017 2:21:37 PM > This lab was  reviewed by Macie Burows on 10/25/2017 at 14:22 PM EDT    ------        Clinical Notes:  discussed colon cancer screening ...he opted for stool cards  .Marland KitchenMarland KitchenUnderstands risks of colon cancer and that Colonoscopy testing despite few  risks involved still is the gold standard screening method  .        **3\. Has not received influenza vaccination in current influenza season**    Clinical Notes: patient understands recommendations and risks.        **4\. Sleep apnea**    Clinical Notes: REview the risks of untreated sleep apnea, including but not  limited to increased fatigue, premature cognitive disorder, pulmonary  hypertension, ASHD, ? adverse effect of life expectancy. Patient will let me  know if they would like to have a referral to sleep specialist.        **5\. Dilated aortic root**    Clinical Notes: recent follow up with Dr Alvino Chapel...we will obtain  notes...asymptomatic...bp control is a must.        **6\. Elevated PSA**    _LAB: Prostate Specific Antigen Screen_ Normal     PSA    2.03      0.00-4.00 - nGm/ml    ------------  Tannen Vandezande 09/17/2017 2:20:49 PM >    ------    _LAB: Urinalysis (Complete)_ Normal  UA Clarity    Clear      Clear -    ------------    UA Spec Grav    1.014      1.003-1.030 -    ------------    UA pH    6.0      5.0-8.0 -    ------------    UA Protein    Negative      Negative - mg/dL    ------------    UA Glucose    Negative      Negative - mg/dL    ------------    UA Ketones    Negative      Negative - mg/dL    ------------    UA Blood    Negative      Negative -    ------------    UA Bili    Negative      Negative -    ------------    UA Urobilinogen    Negative      Negative -    ------------    UA Nitrite    Negative      Negative -    ------------    UA Leuk Est    Negative      Negative - WBC/uL    ------------    UA RBC    1      0-2 - /HPF    ------------    UA WBC    1      0-5 - /HPF    ------------    UA Bacteria    None      None -    ------------    UA Squamous Epi    <1      0-5 - /HPF    ------------    UA Mucous    Few      None -    ------------    UA Color    Light Yellow      Yellow -    ------------      Avner Stroder 09/17/2017 2:20:49 PM >    ------        Clinical Notes: recheck PSA....denies LUTS.        **7\. Depression with anxiety**    Clinical Notes: mood stable, following psychiatry...denies SI.      **Preventive Medicine**    ---      Counseling: Exercise . Seatbelts . Bike helmet . Skin Self Exams .  Sunscreen . Testicular self exam .    ---      **Visit Codes**    ---      [phone] ESTAB PT HEALTHY 40-64 YEARS.    ---      **Procedure Codes**    ---      [phone] MEASURE AIRFLOW RESISTANCE    ---      **Follow Up**    ---    6months bp TM 15 minutes, check andf 1 year MC cpe    Electronically signed by [name] , [name] on 09/18/2017 at 09:53 AM EDT    Sign off status: Completed        * * *        [facility]    [address]    [place], [place] [address]    Tel: [phone]    Fax: [phone]              * * *  Patient: ELENO, Jordan [name] DOB: 1960/05/02 Progress Note: [name],  [name] 09/17/2017    ---    Note generated by eClinicalWorks EMR/PM Software ([email or web address])

## 2017-09-18 ENCOUNTER — Ambulatory Visit

## 2017-09-18 ENCOUNTER — Ambulatory Visit: Admitting: Internal Medicine

## 2017-09-18 NOTE — Progress Notes (Signed)
**  Progress Notes**    ---    **Patient:** Jordan [name], LAABSAccount Number:** [id number]  **Provider:** ECHO     **DOB:** [birth year] **Age:** 42 Y **Sex:** Male  **Date:** 09/18/2017     **Phone:** [phone]     **Address:** [address], [place], [phone]     **Pcp:** MARIANA CHEMALY        * * *         **Subjective:**        ---      **Chief Complaints:**    ------      1\. Per Dr. Alvino Chapel, ANN.    ------     **Medical History:**        ------        **Objective:**        ---         **Assessment:**        ---      **Assessment:**    1\. Aortic dilatation - I77.819 (Primary)    ------        **Plan:**        ---       **1\. Aortic dilatation**    _Imaging: **Echocardiogram_    ---      **Procedure Codes:** [phone] TRANSTHORACIC ECHO, COMPLETE W/DOPPLER & COLOR    ------          **Provider:** ECHO    ---     **Patient:** Jordan [name], Jordan [name] **DOB:** [birth year] **Date:** 09/18/2017    ---    Electronically signed by Lucienne Minks on 09/18/2017 at 08:24 AM EDT    Sign off status: Completed

## 2017-09-19 ENCOUNTER — Ambulatory Visit: Admitting: Internal Medicine

## 2017-09-25 ENCOUNTER — Ambulatory Visit: Admitting: Cardiovascular Disease

## 2017-12-03 ENCOUNTER — Ambulatory Visit: Admitting: Internal Medicine

## 2017-12-03 NOTE — Progress Notes (Signed)
* * *        **[name], [name]**    ------    20 Y old Male, DOB: 1960/10/24    [address] [place], [place], [place] [address]    Home: [phone]    Provider: [name]        * * *    Web Encounter    ---    Answered by    Blenda Nicely    Date: 12/03/2017        Time: 08:12 PM    Jordan    [name]    ------            Reason    [facility]            Message                       Addressed To: Jordan [name]                     Hi - I would like to start getting the hydrochlorothiazide-lisinopril 25 mg-20 mg through the mail in prescription with [facility].  please let me know if you will be able to do this.  thank you Jordan [name]                Action Taken                      [facility]  12/04/2017 8:25:29 AM >       Santos,Amanda  12/04/2017 8:26:38 AM > script sent to above pharmacy.      Dear Jordan [name]  ,                           The requested prescription have been sent to your pharmacy ( [facility] mail order. If you have any questions or concerns about your recent prescciption refills, please contact us via patient portal.             Regards,             Your Care Team             [name] -MD Medical Doctor,      Consuelo Pandy - PA C  Physician Assistent      Kelli Churn- PA C Physician Assistent      Alba Destine - [place]  Medical Assistant      Arville Lime  Medical Assistant,       Byrd Hesselbach Clementes - Admistrative Assistant                      Refills    Refill hydrochlorothiazide-lisinopril tablet, 25 mg-20 mg, orally,  90, 1 tab(s), once a day, 90 days, Refills=3    ------          * * *        **eMessages**   From:    [name]    ------    Created:    2017-12-04 08:28:20.0    Sent:      Subject:    RE:[facility] day [facility] to [facility]    Message:      **Dear Jordan [name]    **The requested prescription have been sent to your pharmacy ( [facility]  mail order. If you have any questions or concerns  about your recent  prescciption refills,  please contact us via patient portal.**    **Regards,**    ** Your Care Team  **    ** [name] -MD Medical Doctor,    ** ** Consuelo Pandy - [place] C Physician Assistent    ** ** Kelli Churn- [place] C Physician Assistent    ** ** Alba Destine - [place] Medical Assistant    ** ** Arville Lime Medical Assistant,    ** ** Byrd Hesselbach Clementes - Admistrative Assistant  **                        ---          * * *          Patient: Jordan [name], Jordan [name] DOB: 1961-01-10 Provider: [name]  12/03/2017    ---    Note generated by eClinicalWorks EMR/PM Software ([email or web address])

## 2018-03-11 ENCOUNTER — Ambulatory Visit: Admitting: Medical

## 2018-03-11 LAB — HX URINALYSIS (COMPLETE)
CASE NUMBER: 2019350001192
HX UA BILIRUBIN: NEGATIVE
HX UA BLOOD: NEGATIVE
HX UA GLUCOSE: NEGATIVE
HX UA KETONES: NEGATIVE
HX UA LEUKOCYTE ESTERASE: NEGATIVE
HX UA NITRITE: NEGATIVE
HX UA PH: 6 (ref 5.0–8.0)
HX UA PROTEIN: NEGATIVE
HX UA RBC: 1 /HPF (ref 0.0–2.0)
HX UA SPECIFIC GRAVITY: 1.013 (ref 1.003–1.03)
HX UA SQUAMOUS EPITHELIAL: <1 (ref 0.0–5.0)
HX UA UROBILINOGEN: NEGATIVE
HX UA WBC: 1 /HPF (ref 0.0–5.0)

## 2018-03-11 LAB — HX MAGNESIUM LEVEL
CASE NUMBER: 2019350001191
HX MAGNESIUM LVL: 2 mg/dL (ref 1.7–2.5)

## 2018-03-11 LAB — HX COMPREHENSIVE METABOLIC PANEL
CASE NUMBER: 2019350001191
HX ALBUMIN LVL: 4.2 g/dL (ref 3.2–5.0)
HX ALKALINE PHOSPHATASE: 65 U/L (ref 30.0–117.0)
HX ALT: 36 U/L (ref 6.0–55.0)
HX ANION GAP: 4 (ref 3.0–11.0)
HX AST: 16 U/L (ref 6.0–40.0)
HX BILIRUBIN TOTAL: 0.7 mg/dL (ref 0.2–1.2)
HX BUN: 18 mg/dL (ref 6.0–20.0)
HX CALCIUM LVL: 9.6 mg/dL (ref 8.5–10.5)
HX CHLORIDE: 101 mmol/L (ref 98.0–110.0)
HX CO2: 33 mmol/L — ABNORMAL HIGH (ref 21.0–32.0)
HX CREATININE: 0.97 mg/dL (ref 0.55–1.3)
HX GLUCOSE LVL: 105 mg/dL (ref 70.0–110.0)
HX POTASSIUM LVL: 4.7 mmol/L (ref 3.6–5.2)
HX SODIUM LVL: 138 mmol/L (ref 136.0–146.0)
HX TOTAL PROTEIN: 7.3 g/dL (ref 6.0–8.4)

## 2018-03-11 LAB — HX HEMOGLOBIN A1C
CASE NUMBER: 2019350001191
HX EST AVERAGE GLUCOSE (EAG): 105 mg/dL
HX HBF (INTERNAL): 0.4 %
HX HEMOGLOBIN A1C: 5.3 %
HX LA1C (INTERNAL): 1.9 %
HX P3 PEAK (INTERNAL): 5 %
HX TOTAL AREA RANGE (INTERNAL): 175441 microvolt/sec (ref 50000.0–350000.0)

## 2018-03-11 LAB — HX VITAMIN B12 LEVEL
CASE NUMBER: 2019350001191
HX VITAMIN B12 LVL: 1516 pg/mL — ABNORMAL HIGH (ref 193.0–986.0)

## 2018-03-11 LAB — HX GLOMERULAR FILTRATION RATE (ESTIMATED)
CASE NUMBER: 2019350001191
HX AFN AMER GLOMERULAR FILTRATION RATE: >90
HX NON-AFN AMER GLOMERULAR FILTRATION RATE: 86 mL/min/{1.73_m2}

## 2018-03-11 LAB — HX FOLATE LEVEL
CASE NUMBER: 2019350001191
HX FOLATE LVL: 10.8 ng/mL

## 2018-03-11 LAB — HX TSH REFLEX PANEL (RECOMMENDED)
CASE NUMBER: 2019350001191
HX 3RD GEN TSH: 3.03 u[IU]/mL (ref 0.358–3.74)

## 2018-03-11 NOTE — Progress Notes (Signed)
* * *        **Jordan [name], Jordan [name]**    ------    38 Y old Male, DOB: 1960/07/08    Account Number: [id number]    [address] [place], [id number]    Home: [phone]    Guarantor: [name] Insurance: [employer] [place], [place] Payer ID:  [id number]    PCP: [name]    Appointment Facility: [facility]        * [phone]    03/11/2018    **Appointment Provider:** Consuelo Pandy, PA    ------      **Supervising Provider:** [name], [name]    ---        **Current Medications**    ---    Taking      * lamotrigine 25 mg tablet 2 tab(s) orally Once a day     ---    * Vitamin D3 2000 intl units tablet 2 cap(s) orally once a day     ---    * clonazepam 0.5 mg tablet 1 tab(s) orally every other day     ---    * fluoxetine 10 mg capsule 1 cap(s) orally once a day     ---    * hydrochlorothiazide-lisinopril 25 mg-20 mg tablet 1 tab(s) orally once a day     ---    Discontinued    * Anusol-HC 25 mg suppository 1 SUPP(s) rectally 2 times a day     ---    * Medication List reviewed and reconciled with the patient     ---      Past Medical History    ---      L2-3 R-side disc bulge and osteophyte- Spinal Stenosis he has been  Injection stem cells, trying to avoid surgery. [name]        ---    Depression and Anxiety.        ---    HTN.        ---    Colonoscopy 2008 Dr [name] repeat in 2018 no polyps.        ---    2014 Elevated PSA Dr [name]. Status post biopsy.        ---    Insomnia anxiety related.        ---    Allergic rhinitis.        ---    Hx of H pylori.        ---    [facility]; [name] therapist 1  x per month.        ---    Echo- normal function. EF 65 %. mildly dilated ascending aorta... repeat in 6  months.[name]        ---    CT brain mucosal thickening of paranasal sinus and mild tonsillary ectopia.  pending neuro consult 08/14/16.        ---    2017 Hep C Screen.. Negative.        ---      **Social History**    ---    Tobacco Use   Are you a? Never smoker  , Patient was counseled on the dangers  of tobacco use: 03/11/2018.    Marital Status: married, 34 yrs married.    Depression Screening II  If you checked off any problems, how difficult have  these problems made it for you to do your work, take care of of things  at  home, or get along with other people Not difficult at all.    Children: sons:1 daughters:2.    no Caffeine, due to high blood pressure.    Sexually active Had sex in the last 12 months (vaginal,anal,oral): Yes, with:  Women only, Use Protection?: No, Prevention strategies discussed: Other, Have  you ever had an STD?: No.    Alcohol (TEXT ONLY): socially.    no Drug use.    Occupation: Financial trader.    no Exercise, should be doing more.      **Allergies**    ---      Tdap injection: swelling - Side Effects    ---    Cymbalta: constipation - Side Effects    ---      **Review of Systems**    ---    _CONSTITUTIONAL_ :    no Fever. no Chills. no Sweats. no Night Sweats. no Weight Loss. no Weight  Gain. no Loss of Appetite. no Weak. no Fatigue.    _CARDIOLOGY_ :    no Chest Pain. no Shortness of Breath,  none  . no Palpitations. no Dizziness.  no Leg Edema. no Fatigue. no Orthopnea. no PND (paroxsymal nocturnal dyspnea).    _NEUROLOGY_ :    no falls. no Headache. no Weakness. c/o Tingling/Numbness,  bilat feet  . no  Speech Abnormality. no Visual Changes. no Dizziness. no Memory Loss. no  Seizures. no Gait Abnormality. no Sleep Problems.    _PSYCHOLOGY_ :    c/o Depression. c/o Anxiety. no Mania. c/o Stressors. c/o Sleep Disturbances.  no Suicidal Ideation. no Paranoia. Hallucinations NO,  none  . no Mental or  Physical Abuse. no Eating Disorder.            **Reason for Appointment**    ---      1\. BP f/u    ---      **History of Present Illness**    ---    _Interim History_ :    57 yr old male, PMHx and MEDS per below, here for routine 6 month f/u for BP.    Home readings 120/78.    No chest pain, shortness of breath.     c. o bilat foot tingling and numbness fora few months.    Tinnitus in his ears worse when he eats sugar.    He is also looking for substitute for Zantac which has been on for years. Due  to recall.    Struggles w/ mood, especially in winter. He is following psych, and stable on  his meds. Denies SI or HI.      **Vital Signs**    ---    Wt 169, Ht 66, BP  172/98  , **Repeat:124/80** , BMI  27.27  , HR 68, O2 Sat  98%.      **Examination**    ---    _General Examination_ :    General Appearance:  NAD  ,  alert and oriented  .    HEENT:  TM's clear and flat bilaterally  ,  Oropharynx clear with MMM  .    Neck, Thyroid :  supple  ,  no thyromegaly  ,  no lymphadenopathy  .    Heart:  RRR  ,  no murmurs  .    Lungs:  clear to auscultation bilaterally  ,  no wheezes/rhonchi/rales  .    Abdomen:  soft, NT/ND, BS present  .    Extremities:  no clubbing, no edema, normal  sensation normal pulses.  .    Peripheral pulses:  normal (2+) bilaterally  .    normal monofilament.          **Assessments**    ---    1\. Essential hypertension - I10 (Primary)    ---    2\. Gastroesophageal reflux disease, esophagitis presence not specified -  K21.9    ---    3\. Paresthesias - R20.2    ---    4\. Depression with anxiety - F41.8    ---      **Treatment**    ---      **1\. Essential hypertension**    Continue hydrochlorothiazide-lisinopril tablet, 25 mg-20 mg, 1 tab(s), orally,  once a day    _LAB: Comprehensive Metabolic Panel_    _LAB: Urinalysis (Complete)_    Notes: stable and at goal. , Discussed importance of keeping blood pressure  well controlled. DASH diet. Take medications daily as prescribed. Regular  cardiovascular exercise 30 min. most days of the week.    ---        **2\. Gastroesophageal reflux disease, esophagitis presence not specified**    Notes:  trial Pepcid in pace of Zantac. anti reflux diet. no warning sxs. f/u  prn.  .        **3\. Paresthesias**    _LAB: Folate Level_    _LAB: Hemoglobin A1c_    _LAB:  Magnesium Level_    _LAB: TSH Reflex Panel (Recommended)_    _LAB: Vitamin B12 Level_    Notes: check labs as noted. normal exam including monofilament.        **4\. Depression with anxiety**    Notes: stable w/ meds. cont. care w/ psych . No SI or HI.        **5\. Others**    Continue lamotrigine tablet, 25 mg, 2 tab(s), orally, Once a day    Continue Vitamin D3 tablet, 2000 intl units, 2 cap(s), orally, once a day    Continue clonazepam tablet, 0.5 mg, 1 tab(s), orally, every other day    Continue fluoxetine capsule, 10 mg, 1 cap(s), orally, once a day      **Visit Codes**    ---      38756 OFFICE/OUTPATIENT VISIT, ESTAB.[name]    ---      **Follow Up**    ---    cpe, sooner prn.    **Appointment Provider:** Consuelo Pandy, PA    Electronically signed by [name] , [name] on 03/22/2018 at 03:54 PM EST    Sign off status: Completed        * * *        [facility]    [address]    [place], [place] [address]    Tel: [phone]    Fax: [phone]              * * *          Patient: Jordan [name], Jordan [name] DOB: 20-Mar-1961 Progress Note: Consuelo Pandy, PA  03/11/2018    ---    Note generated by eClinicalWorks EMR/PM Software ([email or web address])

## 2018-04-29 ENCOUNTER — Ambulatory Visit: Admitting: Otolaryngology

## 2018-04-29 NOTE — Progress Notes (Signed)
* * *        **  Jordan [name], Jordan [name]**    ------    56 Y old Male, DOB: July 11, 1960    [address], [place], [place], [place] [address]    Home: [phone]    Provider: Gerhard Munch        * * *    Telephone Encounter    ---    Answered by    Murrell Redden A    Date: 04/29/2018        Time: 03:28 PM    Reason    2/10 2nd req to cb    ------            Message                      1 yr and audio                Action Taken                      [facility]  04/30/2018 02:40:54 PM > l/m to [email or web address]      ODonnell,Gail  05/06/2018 02:11:43 PM > 2nd req to [email or web address]                    * * *                ---          * * *          Patient: Jordan [name], Jordan [name] DOB: [birth year] Provider: [name] R  04/29/2018    ---    Note generated by eClinicalWorks EMR/PM Software ([email or web address])

## 2018-05-02 ENCOUNTER — Ambulatory Visit: Admitting: Cardiovascular Disease

## 2018-05-02 NOTE — Progress Notes (Signed)
 * * *        [facility]**        ---    Kieth Brightly. Donnetta Simpers, MD [facility] Barbera Setters Byrd Hesselbach, MD [facility]    Darlyn Chamber, MD [facility] Roosvelt Maser, MD [facility] Shanon Brow, MD [facility]  Nile Dear. Audley Hose, MD [facility]    Kandis Mannan A. Karie Mainland, MD [facility] Johnell Comings. MacNaught, MD [facility] Mitchel Honour, MD Erma Pinto, MD [facility]    Colletta Maryland, MD Weston Anna, MD [facility] Charlesetta Shanks, MD Kerby Moors, NP    Tonna Corner, NP Danelle Earthly, NP Caroleen Hamman, NP        * * *     **Patient Name:** Jordan [name]  **Date:** 05/02/2018    ------     **DOB:** 1960-03-29     **Referring Provider:** [name]  **Appointment Provider:** Roosvelt Maser, MD        * * *    05/02/2018 Progress Notes: Roosvelt Maser, MD    ------    ---      Current Medications    ---    Taking    * lisinopril 20 mg tablet 1 tab(s) orally once a day    ---    * Prozac 20 mg capsule 1 cap(s) orally once a day    ---    * Klonopin 0.5 mg tablet 1 tab(s) orally 1 times a day    ---    * Medication List reviewed and reconciled with the patient    ---     Past Medical History    ---     Dilated aorta    ---    ..6/19 &4/18: Echo 4cm [facility]    ---    HTN    ---    Depression/anxiety sees Psych    ---     Family History    ---     Father: deceased, died CHF/HTN, diagnosed with Hypertension, Heart Disease    ---    Mother: alive, HTN, Hypertension    ---     Social History    ---    no ETOH.    no Tobacco Status: **never smoked**.    No Have you fallen in the past year?.    Advised to lose weight: Yes .    Occupation: Curator on a golf course.     Allergies    ---     N.K.D.A.    ---      Reason for Appointment    ---     1\. ANN s/w patient wife;R/S BY PT.    ---     History of Present Illness    ---     _History of Present Illness_ :    Hypertensive golf course mechanic with a mildly dilated ascending aorta.    CV risk 8% (2020)    I initially saw him in 2018    >>>>>>>>>>>>>>>>>>>>>>>>>>>>    Returns  on 05/02/2018    I have not seen him since our initial evaluation in 2018.    Since last visit the patient had an echocardiogram (6/19) demonstrating  preserved EF with borderline ascending aortic dilatation at 4.0 unchanged from  2018. I reviewed this echocardiogram personally.    Patient returns today feeling well in general. His blood pressure was elevated  today initially and on repeat in both arms but he states on his home blood  pressure cuff he  has been getting readings that have been well controlled in  the 110+ range.    He has had intermittent chest discomfort that he describes as a focal pain  that occurs underneath his left nipple increased with inspiration typically  when anxious or after some mild physical activity. He does not have symptoms  when he is exerting himself nor does he have symptoms that limit his exertion.  Symptoms occur intermittently and are infrequent.    Patient's last labs were in December 2019 with creatinine 0.97 potassium 4.7.  A1c was 5.3. His last lipid panel showed a LDL 114 HDL 62.    EKG personally reviewed on 05/02/18 showing NSR with non-specific ST changes    ROS: No sob at rest, unexplained weight loss, night sweats, headache, nausea,  pleuritic chest pain, unexplained dramatic weight loss, urinary hesitancy,  eczema.     Vital Signs    ---    HR 56, BP 130/90, Wt 169, Ht 5'7, BMI 33.00, Med Assist: AN.     Examination    ---     _.._ :    General Appearance Well developed and nourished, No Acute distress, Looks  stated age . ENMT Atraumatic, normocephalic, Dentition good, Trachea is  midline. Neck No JVD, 2+ carotids without bruits. Heart Exam S1S2 normal, RRR,  PMI non-displaced, No S3, No S4. Murmurs No significant murmurs. Pulmonary  Clear to ausculatation bilaterally, No respiratory Distress;. GI system no  hepatosplenomegaly, no distention . Extremities No edema bilaterally.  Pyschiatric communicating appropriately . Peripheral pulses intact throughout.          Impression/Recommendations    ---      **1\. Aortic dilatation**    _IMAGING: **EKG_    _IMAGING: **Echocardiogram (Ordered for 05/03/2019)_    Clinical Notes:    Asymptomatic mildly dilated ascending aorta stable as of 2019    BP management paramount in regards to reducing his risk for progressive  dilatation at such a young age    BP mildly elevated today although he states very well controlled at home less  than 120 typically on a calibrated BP cuff. He historically has had well-  controlled blood pressures in our office and therefore I have not changed his  medication for now but asked him to call me should he have any readings above  130 systolically at home.    Symptoms of chest discomfort especially on tearing quality located posteriorly  were reviewed again with him as well as the importance of seeking medical  attention emergently to obtain a CT scan at this should not ever occur. His  current chest symptoms are not concerning for either CAD or his dilated  ascending aorta.    I spoke with him in regards to his cardiovascular risk which calculates to be  8%. Recommendations are that statin therapy be initiated for any risk greater  than 7.5%. While this may reduce his risk for cardiovascular event such as  MI/CVA, this would be unlikely to change his risk for progressive aortic  dilatation/rupture. For now he declines use of statin therapy understanding  these risks. I explained as he gets older his risk will continue to increase  and I would strongly favor statin therapy once his risk is greater than 10%  over 10 years.    I will see him back in one year with a repeat echo at that time    .    ---        **2\. Essential  Hypertension**    Clinical Notes:    Therapy for condition as noted above.    .        **3\. Others**    Clinical Notes:    Plan of therapy as noted above.    .     Procedure Codes    ---     93000 IH    ---     Follow Up    ---    1 Year    Electronically signed by Lowella Fairy MD on  05/02/2018 at 10:02 AM EST    Sign off status: Completed        * * [facility]    [address]    [place], [place] [address]    Tel: [phone]    Fax: [phone]              * * *         Patient: Jordan [name], Jordan [name] DOB: 11-12-1960 Progress Note: Roosvelt Maser, MD  05/02/2018    ---    Note generated by eClinicalWorks EMR/PM Software ([email or web address])

## 2018-05-03 ENCOUNTER — Ambulatory Visit: Admitting: Internal Medicine

## 2018-06-13 ENCOUNTER — Ambulatory Visit: Admitting: Internal Medicine

## 2018-06-13 ENCOUNTER — Ambulatory Visit

## 2018-06-13 NOTE — Progress Notes (Signed)
* * *        **Jordan [name], Jordan [name]**    ------    80 Y old Male, DOB: [birth year]    Account Number: [id number]    [address], [place], [place]    Home: [phone]    Guarantor: [name] Insurance: [employer] [place], [place] Payer ID:  [id number]    PCP: [name]    Appointment Facility: [facility]        * [phone]    06/13/2018    **Appointment Provider:** Kelli Churn, PA    ------      **Supervising Provider:** [name], [name]    ---        **Current Medications**    ---    Taking      * lamotrigine 25 mg tablet 2 tab(s) orally Once a day     ---    * Vitamin D3 2000 intl units tablet 2 cap(s) orally once a day     ---    * clonazepam 0.5 mg tablet 1 tab(s) orally every other day     ---    * fluoxetine 10 mg capsule 1 cap(s) orally once a day     ---    * hydrochlorothiazide-lisinopril 25 mg-20 mg tablet 1 tab(s) orally once a day     ---      Past Medical History    ---      L2-3 R-side disc bulge and osteophyte- Spinal Stenosis he has been  Injection stem cells, trying to avoid surgery. [name]        ---    Depression and Anxiety.        ---    HTN.        ---    Colonoscopy 2008 Dr [name] repeat in 2018 no polyps.        ---    2014 Elevated PSA Dr [name]. Status post biopsy.        ---    Insomnia anxiety related.        ---    Allergic rhinitis.        ---    Hx of H pylori.        ---    [facility] Mardee Postin NP; [name] therapist 1  x per month.        ---    Echo- normal function. EF 65 %. mildly dilated ascending aorta... repeat in 6  months.[name]        ---    CT brain mucosal thickening of paranasal sinus and mild tonsillary ectopia.  pending neuro consult 08/14/16.        ---    2017 Hep C Screen.. Negative.        ---      **Surgical History**    ---      Prostate Biopsy negative 2014    ---      **Social History**    ---    Tobacco Use    Are you a? _Never smoker_    Patient was counseled on the dangers of  tobacco use: _12/16/2019_    Marital Status: married, 34 yrs married.    Depression Screening II    If you checked off any problems, how difficult have these problems made it for  you to do your work, take care of of things at home, or get along with other  people _Not difficult at  all_    Children: sons:1 daughters:2.    no Caffeine, due to high blood pressure.    Sexually active Had sex in the last 12 months (vaginal,anal,oral): Yes, with:  Women only, Use Protection?: No, Prevention strategies discussed: Other, Have  you ever had an STD?: No.    Alcohol (TEXT ONLY): socially.    no Drug use.    Occupation: Financial trader.    no Exercise, should be doing more.      **Allergies**    ---      Tdap injection: swelling - Side Effects    ---    Cymbalta: constipation - Side Effects    ---      **Hospitalization/Major Diagnostic Procedure**    ---      Past Hospitalization Voluntary 3 days    ---        **Reason for Appointment**    ---      1\. see TE 06/13/2018    ---      **History of Present Illness**    ---    _Interim History_ :    58 yo male presents today c/o wheezing and cough x 2 weeks. +clear  rhinorrhea +itchy eyes. Usually allergies worse in Spring and Fall, similar  feeling to allergy flare he had in the past. Has been taking claritin but not  giving him enough relief. Occasional sputum production, always clear. Tried  mucinex, minimal relief. No fever, chills, bodyaches. No SOB. No n/v/d. A bit  more tired but otherwise feeling well.    _Telehealth Appointments_ :    Telehealth visit is being done on emergency basis due to Coronavirus covid -19  pandemic, patient waiver consented either electronically via portal or  verbally during this visit.    Travel in the last 14 days  NO  .    Exposure to any known or susicious with COVID 19  NO  .      **Examination**    ---    _General Examination_ :    General Appearance:  NAD  ,  alert and oriented  ,  well nourished and  hydrated, speaking full  sentences  .    Skin:  anicteric  .    Oral cavity:  moist mucus membranes  .    HEENT:  EOMI  ,  clear conjunctiva  .          **Assessments**    ---    1\. Seasonal allergic rhinitis, unspecified trigger - J30.2 (Primary)    ---    2\. Wheezing - R06.2    ---      **Treatment**    ---      **1\. Seasonal allergic rhinitis, unspecified trigger**    Clinical Notes:    Consistent with seasonal allergies. Advised to continue daily antihistamine  (take 2 claritin daily) and start flovent inhaler BID for wheezing and cough.  Can continue inhaler through spring season. If no better in 1 week or develops  persistent fever or SOB advised to f/u. Although most likely allergy sxs,  advised to limit contact with others. Advised to isolate for next 2 weeks,  discussed frequent handwashing and hygiene.    .    ---        **2\. Wheezing**    Start Flovent HFA aerosol, CFC free 110 mcg/inh, 2 puff(s), inhaled, 2 times a  day, 30 day(s), 1, Refills 2    Clinical Notes: See above.      **Visit  Codes**    ---      [address] OFFICE/OUTPATIENT VISIT, ESTAB.[name] Modifiers: 95    ---      **Follow Up**    ---    10/07/2018, prn    **Appointment Provider:** Kelli Churn, PA    Electronically signed by [name] , [name] on 06/15/2018 at 10:45 AM EDT    Sign off status: Completed        * * *        [facility]    [address]    [address]rd Floor, [address]    [place], [place] [address]    Tel: [phone]    Fax: [phone]              * * *          Patient: Jordan [name], Jordan [name] DOB: 02/24/1961 Progress Note: Kelli Churn, PA  06/13/2018    ---    Note generated by eClinicalWorks EMR/PM Software ([email or web address])

## 2018-06-13 NOTE — Progress Notes (Signed)
* * *        **  Jordan [name], Jordan [name]**    ------    15 Y old Male, DOB: 10-07-1960    [address], [place], [place], [place] [address]    Home: [phone]    Provider: [name]        * * *    Web Encounter    ---    Answered by    Bea Laura    Date: 06/13/2018        Time: 09:23 AM    Reason    Telehealth Waiver    ------            Action Taken                      Hammond,Kristen E 06/13/2018 9:23:31 AM >                     * * *        **eMessages**   From:    Hammond,Kristen    ------    Created:    2018-06-13 09:23:38.0    Sent:      Subject:    VF:[id number] Waiver    Message:                      FINANCIAL RESPONSIBILITY FOR TELEHEALTH ENCOUNTERS                  Dear Patient,                   Telehealth encounters may or may not be a covered benefit of your health insurance plan, and it is your responsibility to contact their Member Services Department to determine this.                          If Telehealth is a covered benefit, copayments and deductibles may still apply, for which you are financially responsible.  These amounts are determined by your health insurance plan. If these types of visits are not covered, there is a $30 copay that will be billed directly to you.                         By responding to this email, you have read the information regarding my Telehealth visit.  I agree to be solely responsible for contacting my insurance for benefit information, and I agree that I will be financially responsible for payment as determined by them.                              Thank you,             The Staff at [facility]                        ---          * * *          Patient: Jordan [name], Jordan [name] DOB: 10-Jul-1960 Provider: [name]  06/13/2018    ---    Note generated by eClinicalWorks EMR/PM Software ([email or web address])

## 2018-06-13 NOTE — Progress Notes (Signed)
* * *        **  EASON, HOUSMAN**    ------    23 Y old Male, DOB: [birth year]    [address] [place], [place], [place] [address]    Home: [phone]    Provider: [name]        * * *    Web Encounter    ---    Answered by    Blenda Nicely    Date: 06/13/2018        Time: 08:49 AM    Caller    Jordan [name]    ------            Reason    Chest congestion            Message                       Addressed To: [name]                     I am no week two of chest congestion with some burning feeling, some sinus congestion and some coughing.  At first I though it was allergies and have been taking clarain but it is not helping.  I have also trued Mucinex but nothing is breaking it up.  Is there something else I can try or get a prescription for?  I can also call for Telemedical appointment if this is an option.            Thank you             [name]                  Action Taken                      [facility]  06/13/2018 8:50:45 AM >      Carlisle Beers , RN 06/13/2018 9:13:42 AM > No recent travel.  No known + exposure within 14 days of persons suspect for Covid 19. As above.  No fever or chills.  TeleHealth scheduled                    * * *                ---          * * *          Patient: [name], [name] DOB: 05/28/1960 Provider: [name]  06/13/2018    ---    Note generated by eClinicalWorks EMR/PM Software ([email or web address])

## 2018-06-15 ENCOUNTER — Ambulatory Visit: Admitting: Internal Medicine

## 2018-06-15 NOTE — Progress Notes (Signed)
* * *        **  Jordan [name], Jordan [name]**    ------    53 Y old Male, DOB: 1960-06-04    [address], [place], [place], [place] [address]    Home: [phone]    Provider: [name]        * * *    Web Encounter    ---    Answered by    [name]    Date: 06/15/2018        Time: 10:45 AM    Message                      see last OV, I would send in rescue inhaler        ------            Action Taken                      [facility]  06/15/2018 10:45:58 AM >      Huoth,Allison  06/15/2018 7:59:30 PM > Hi Jordan [name], I have sent another inhaler to your pharmacy. Continue with flovent inhaler twice a day, but if you feel like you need additional relief throughout day you can use this inhaler as needed every 4 hours. Best- [name] PA                      Refills    Start albuterol powder, 90 mcg/inh, inhaled, 1, 2 puff(s), every 6  hours as needed for wheezing, 30 day(s), Refills=0    ------          * * *        **eMessages**   From:    Huoth,Allison    ------    Created:    2018-06-15 20:01:09.0    Sent:      Subject:    RE:    Message:                       Hi Jordan [name], I have sent another inhaler to your pharmacy. Continue with flovent inhaler twice a day, but if you feel like you need additional relief throughout day you can use this inhaler as needed every 4 hours. BestRevonda Standard PA                              ---          * * *          Patient: Jordan [name], Jordan [name] DOB: February 05, 1961 Provider: [name]  06/15/2018    ---    Note generated by eClinicalWorks EMR/PM Software ([email or web address])

## 2018-10-07 ENCOUNTER — Ambulatory Visit: Admitting: Internal Medicine

## 2018-10-07 LAB — HX URINALYSIS (COMPLETE)
CASE NUMBER: 2020195000785
HX UA BILIRUBIN: NEGATIVE — NL
HX UA BLOOD: NEGATIVE — NL
HX UA GLUCOSE: NEGATIVE — NL
HX UA KETONES: NEGATIVE — NL
HX UA LEUKOCYTE ESTERASE: NEGATIVE — NL
HX UA NITRITE: NEGATIVE — NL
HX UA PH: 5 — NL (ref 5.0–8.0)
HX UA PROTEIN: NEGATIVE — NL
HX UA RBC: <1 — NL (ref 0.0–2.0)
HX UA SPECIFIC GRAVITY: 1.019 — NL (ref 1.003–1.03)
HX UA SQUAMOUS EPITHELIAL: <1 — NL (ref 0.0–5.0)
HX UA UROBILINOGEN: NEGATIVE — NL
HX UA WBC: 1 /HPF — NL (ref 0.0–5.0)

## 2018-10-07 LAB — HX BASIC METABOLIC PANEL
CASE NUMBER: 2020195000784
HX ANION GAP: 6 — NL (ref 3.0–11.0)
HX BUN: 16 mg/dL — NL (ref 6.0–20.0)
HX CALCIUM LVL: 9.6 mg/dL — NL (ref 8.5–10.5)
HX CHLORIDE: 101 mmol/L — NL (ref 98.0–110.0)
HX CO2: 30 mmol/L — NL (ref 21.0–32.0)
HX CREATININE: 1.02 mg/dL — NL (ref 0.55–1.3)
HX GLUCOSE LVL: 104 mg/dL — NL (ref 70.0–110.0)
HX POTASSIUM LVL: 4.6 mmol/L — NL (ref 3.6–5.2)
HX SODIUM LVL: 137 mmol/L — NL (ref 136.0–146.0)

## 2018-10-07 LAB — HX PROSTATE SPECIFIC ANTIGEN SCREEN
CASE NUMBER: 2020195000784
HX PSA: 2.28 ng/mL — NL (ref 0.0–4.0)

## 2018-10-07 LAB — HX LIPID PANEL
CASE NUMBER: 2020195000784
HX CHOL: 205 mg/dL — ABNORMAL HIGH
HX HDL: 64 mg/dL — NL
HX LDL: 127 mg/dL — NL
HX TRIG: 70 mg/dL — NL

## 2018-10-07 LAB — HX STOOL CARDS: HX RESULT: NEGATIVE

## 2018-10-07 LAB — HX GLOMERULAR FILTRATION RATE (ESTIMATED)
CASE NUMBER: 2020195000784
HX AFN AMER GLOMERULAR FILTRATION RATE: >90
HX NON-AFN AMER GLOMERULAR FILTRATION RATE: 80 mL/min/{1.73_m2}

## 2018-10-07 NOTE — Progress Notes (Signed)
* * *        **Jordan [name]**    ------    58 Y old Male, DOB: May 02, 1960    Account Number: [id number]    [address], [place], [place]    Home: [phone]    Guarantor: [name] Insurance: [employer] [place] Payer ID:  [id number]    Appointment Facility: [facility]        * * *    10/07/2018    Progress Note: Jordan [name], [name]    ------    ---        **Current Medications**    ---    Taking      * Vitamin B12 1000 mcg tablet 1 1/2 tab(s) orally once a day     ---    * Vitamin D3 2000 intl units tablet 2 cap(s) orally once a day     ---    * clonazepam 0.5 mg tablet 1 tab(s) orally every other day     ---    * fluoxetine 10 mg capsule 1 cap(s) orally once a day     ---    * hydrochlorothiazide-lisinopril 25 mg-20 mg tablet 1 tab(s) orally once a day     ---    * Flovent HFA CFC free 110 mcg/inh aerosol 2 puff(s) inhaled 2 times a day, Notes: prn in allergy season     ---    * albuterol 90 mcg/inh powder 2 puff(s) inhaled every 6 hours as needed for wheezing, Notes: prn in allergy season     ---    Discontinued    * lamotrigine 25 mg tablet 2 tab(s) orally Once a day     ---    * Medication List reviewed and reconciled with the patient     ---      Past Medical History    ---      L2-3 R-side disc bulge and osteophyte- Spinal Stenosis he has been  Injection stem cells, trying to avoid surgery. [name]        ---    Depression and Anxiety.        ---    HTN.        ---    Colonoscopy 2008 Jordan Jordan [name] repeat in 2018 no polyps, stool cards 2019 negative  .        ---    2014 Elevated PSA Jordan [name]. Status post biopsy.        ---    Insomnia anxiety related.        ---    Allergic rhinitis.        ---    Hx of H pylori.        ---    [name] [name]; Jordan [name] therapist 1  x per month.        ---    Echo- normal function. EF 65 %. mildly dilated ascending aorta... repeat in 6  months.[name]        ---    CT brain mucosal thickening  of paranasal sinus and mild tonsillary ectopia.  pending neuro consult 08/14/16.        ---    2017 Hep C Screen.. Negative.        ---      **Surgical History**    ---      Prostate Biopsy negative 2014    ---      **  Family History**    ---      Father: deceased, HTN, Kidney failure, Dyalsis, Heart failure, diagnosed  with Hypertension, Heart Disease    ---    Mother: alive 65 yrs, HTN, [place], Hypertension    ---    Daughter 1: alive 97 yrs    ---    Paternal Grand Father: deceased    ---    Paternal Grand Mother: deceased 62 yrs    ---    Maternal Grand Father: deceased, CHF, Heart Disease    ---    Maternal Grand Mother: deceased, cancer at age 46, Cancer    ---    1 sister(s) - healthy. 1 son(s) , 2 daughter(s) - healthy.    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    ---      **Social History**    ---    Tobacco Use  Are you a? **Never smoker** , Patient was counseled on the  dangers of tobacco use: **10/07/2018** .    Marital Status: married, 36 yrs married.    Depression Screening II  If you checked off any problems, how difficult have  these problems made it for you to do your work, take care of of things at  home, or get along with other people **Not difficult at all** .    Children: sons:1 daughters:2.    no Caffeine, due to high blood pressure.    Sexually active: Had sex in the last 12 months (vaginal,anal,oral): Yes, with:  Women only, Use Protection?: No, Prevention strategies discussed: Other, Have  you ever had an STD?: No.    Depression Screening I  Little interest or pleasure in doing things **Several  days** , Feeling down, depressed, or hopeless **Several days** , Trouble  falling or staying asleep, or sleeping too much **Several days** , Feeling  tired or having little energy **Several days** , Poor appetite or overeating  **Several days** , Feeling bad about yourself - or that you are a failure or  have let yourself or your family down **Several  days** , Trouble concentrating  on things, such as reading the newspaper or watching television **Several  days** , Moving or speaking so slowly that other people could have noticed. Or  the opposite-being so figety or resltess that you have been moving around a  lot more than usual **Not at all** , Thoughts that you would be better off  dead, or of hurting yourself **Several days(Consider Suicide Assessment  Risk)** , Total Score **8** , Interpretation **Mild Depression** .    Alcohol (TEXT ONLY): socially.    no Drug use.    Occupation: Financial trader.    no Exercise, should be doing more.      **Allergies**    ---      Tdap injection: swelling - Side Effects    ---    Cymbalta: constipation - Side Effects    ---      **Hospitalization/Major Diagnostic Procedure**    ---      Past Hospitalization Voluntary 3 days    ---      **Review of Systems**    ---    _CPE FULL ROS_ :    no Visual change,  advised yearly eye exams  . no Hearing loss. no [name]  symptoms. no Headaches. no Oral lesions. no Shortness of breath. no Chest  pain. no Palpitations. no Back Pain,  Chronic and stable, no  warning signs  .  no Dysuria,  denies LUTS  . no Urinary frequency or incontinence. no Diarrhea  or constipation. no Rectal bleeding. Weakness or numbness  Intermmitent, lower  sciatica  . no Lightheadedness or dizziness. no Change in skin lesions. no  Edema. no Difficulty with speech or ambulation. no Arthralgias or myalgias. no  Significant weight loss or gain. no Mood changes. no Nausea or vomiting. no  Reflux symptoms. no Abdominal Pain.            **Reason for Appointment**    ---      1\. Annual visit    ---      **History of Present Illness**    ---    _Interim History_ :    58 year old male, PMH as noted below, presents for annual exam. Meds as per  list. Last labs reviewed. following Psychiatrist, cardiology Jordan [name] and Back  specialist. Hx of elevated PSA work up with Jordan [name] before., Patient is  practicing social  distancing, using mask, denies any covid contact, family is  well.    he brought in stool cards today planning colo at age 69    Glaucoma screen- over a year. ([facility])    Check [facility]-Clonazepam last filled 10/03/2018.QTY:16tabs, 32days by Ladora Daniel.    Shingrixadvised.    _PHQ 9 Score_ :    Score  **8** . Completed on: Date **10/07/2018** .      **Vital Signs**    ---    Wt 163, Ht 66, BP  144/90  , **Repeat:122/78** , BMI  26.31  , Temp 98.1, HR  64, Peak Flow 450, O2 Sat 98%RA.      **Examination**    ---    _General Examination_ :    General Appearance:  Well appearing, in NAD  .    Skin:  no abnormal lesions  .    Oral cavity:  clear  ,  moist mucus membranes  .    HEENT:  Head is atraumatic and normocephalic. Sclerae are anicteric.  Conjunctivae are not injected. Mucous membranes are moist, without oral  lesions. Nasal passages are clear. TMs are clear bilaterally  .    Neck, Thyroid :  Supple. Carotid upstroke is intact, without bruit, cervical  adenopathy, or thyromegaly noted  .    Heart:  Regular rate and rhythm, without murmur or click appreciated  .    Lungs:  clear to auscultation bilaterally  .    Abdomen:  Positive bowel sounds. Soft and nontender, without mass,  organomegaly, or bruit  .    Male Genitourinary:  mild enlargement of prostate, no nodules  .    Extremities:  No cyanosis, clubbing, or edema  .    Peripheral pulses:  Exam reveals all pulses to be intact, without bruit  .    Neurologic Exam:  non-focal exam  .          **Assessments**    ---    1\. Routine medical exam - Z00.00 (Primary)    ---    2\. Screen for colon cancer - Z12.11    ---    3\. Essential hypertension - I10    ---    4\. 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 Dilated aortic root - I77.810    ---    6\. Sleep apnea - G47.30    ---    7\. Depression with anxiety - F41.8    ---      **  Treatment**    ---      **1\. Routine medical exam**    _LAB: Lipid Panel_   Chol    205    H     <=200 - mg/dL    ------------    HDL    64      >=40 - mg/dL    ------------    Trig    70      <=150 - mg/dL    ------------    LDL    127      <=130 - mg/dL    ------------    Status    Fasting      \-    ------------      Alysa Duca 10/07/2018 9:34:07 PM > This lab was reviewed by Jordan [name] on 10/07/2018 at 21:34 PM EDT    ------    _LAB: Prostate Specific Antigen Screen_ PSA    2.28      0.00-4.00 - nGm/ml    ------------      Makylee Sanborn 10/07/2018 9:34:07 PM > This lab was reviewed by Jordan [name] on 10/07/2018 at 21:34 PM EDT    ------        Clinical Notes: counselled re: healthy lifestyle, healthy diet and exercise  recommended. BMI goal for age less than 25. Immunizations are up to date.  Recommended flu shot. regular dental and eye exam advised.        **2\. Screen for colon cancer**    _LAB: stool cards_ Negative X3      Santos,Amanda 10/07/2018 8:32:14 AM >  Jomari Bartnik 10/07/2018 5:15:36 PM > This lab was reviewed by Jordan [name] on 10/07/2018 at 17:15 PM EDT    ------        Clinical Notes:  discussed colon cancer screening ...he opted for stool cards  .Marland KitchenMarland KitchenUnderstands risks of colon cancer and that Colonoscopy testing despite few  risks involved still is the gold standard screening method  .        **3\. Essential hypertension**    _LAB: Basic Metabolic Panel_   Creatinine    1.020      0.550-1.300 -  mg/dL    ------------    Sodium Lvl    137      136-146 - mmol/L    ------------    Potassium Lvl    4.6      3.6-5.2 - mmol/L    ------------    Chloride    101      98-110 - mmol/L    ------------    CO2    30      21-32 - mmol/L    ------------    Calcium Lvl    9.6      8.5-10.5 - mg/dL    ------------    Glucose Lvl    104      70-110 - mg/dL    ------------    Anion Gap    6      3-11 -    ------------    BUN    16      6-20 - mg/dL    ------------      [facility] 10/07/2018  9:34:07 PM > This lab was reviewed by Jordan [name] on 10/07/2018 at 21:34 PM EDT    ------    _LAB: Urinalysis (Complete)_ UA Clarity    Clear      Clear -    ------------    UA Spec Grav  1.019      1.003-1.030 -    ------------    UA pH    5.0      5.0-8.0 -    ------------    UA Protein    Negative      Negative - mg/dL    ------------    UA Glucose    Negative      Negative - mg/dL    ------------    UA Ketones    Negative      Negative - mg/dL    ------------    UA Blood    Negative      Negative -    ------------    UA Bili    Negative      Negative -    ------------    UA Urobilinogen    Negative      Negative -    ------------    UA Nitrite    Negative      Negative -    ------------    UA Leuk Est    Negative      Negative - WBC/uL    ------------    UA RBC    <1      0-2 - /HPF    ------------    UA WBC    1      0-5 - /HPF    ------------    UA Bacteria    None      None -    ------------    UA Squamous Epi    <1      0-5 - /HPF    ------------    UA Mucous    Few      None -    ------------    UA Color    Yellow      Yellow -    ------------      Doristine Shehan 10/07/2018 9:34:07 PM > This lab was reviewed by Jordan [name] on 10/07/2018 at 21:34 PM EDT    ------        Clinical Notes: blood pressure at goal...continue current therapy...check  lytes, renal function and urine for proteinuria. Low salt diet encouraged.        **4\. Dilated aortic root**    Clinical Notes: Echo 2019 stable, following Jordan [name].        **5\. Sleep apnea**    Clinical Notes: Not using cpap or oral device....long term complications  discussed incucdding progressive pulmonary hypertension, cardiomyopathies and  cardiac arrhthimas.      **Preventive Medicine**    ---      Counseling: Exercise . Seatbelts . Bike helmet . Skin Self Exams .  Sunscreen . Testicular self exam .    ---      **Visit Codes**    ---      [phone]  ESTAB PT HEALTHY 40-64 YEARS.    ---      **Procedure Codes**    ---      [phone] MEASURE AIRFLOW RESISTANCE    ---      **Follow Up**    ---    6 months BP check 15 minutes AH or TM and 1 year [facility]    Electronically signed by Jordan [name] , [name] on 10/07/2018 at 08:38 AM EDT    Sign off status: Completed        * * *        [facility]    [address]  [place], [place] [address]    Tel: [phone]    Fax: [phone]              * * *          Patient: Jordan [name], Jordan [name] DOB: 08/06/1960 Progress Note: Jordan [name],  [name] 10/07/2018    ---    Note generated by eClinicalWorks EMR/PM Software ([email or web address])

## 2018-10-11 ENCOUNTER — Ambulatory Visit: Admitting: Internal Medicine

## 2018-10-11 NOTE — Progress Notes (Signed)
* * *        **  Jordan [name], Jordan [name]**    ------    33 Y old Male, DOB: February 08, 1961    [address] [place], [place], [place] [address]    Home: [phone]    Provider: [name]        * * *    Web Encounter    ---    Answered by    Blenda Nicely    Date: 10/11/2018        Time: 07:52 PM    Caller    Jordan [name]    ------            Reason    back problem discussed at physical            Message                       Addressed To: Jordan [name], you mentioned during my physical that the best doctors to see in [place] for my back where at [facility] if I am not mistaken.  Is there a doctor you would recommend I try to get an appointment with?  Thank you                Action Taken                      [facility]  10/14/2018 9:11:23 AM >      Santos,Amanda  10/14/2018 9:18:00 AM >      Jordan [name]  10/15/2018 9:57:40 AM > Hi Treyce, Dr Electa Sniff  or Dr Jonny Ruiz Chi at [facility]. Thanks                     * * *        **eMessages**   From:    Jordan [name]    ------    Created:    2018-10-15 09:58:36.0    Sent:      Subject:    PX:TGGY problem discussed at physical    Message:                      Jordan [name]  10/15/2018 9:57:40 AM > Hi Jordan [name], Dr Electa Sniff  or Dr Jonny Ruiz Chi at [facility]. Thanks                        ---          * * *          Patient: Jordan [name], Jordan [name] DOB: 07-10-1960 Provider: [name]  10/11/2018    ---    Note generated by eClinicalWorks EMR/PM Software ([email or web address])

## 2019-03-04 ENCOUNTER — Ambulatory Visit: Admitting: Internal Medicine

## 2019-03-04 NOTE — Progress Notes (Signed)
* * *      Jordan [name], Jordan [name] **DOB:** 03-Aug-1960 (58 yo [name]) **Acc No.** [id number] **DOS:**  03/04/2019    ---        [name]**    ------    58 Y old Male, DOB: [birth year]    [address] [place], [place], [place] [address]    Home: [phone]    Provider: [name]        * * *    Web Encounter    ---    Answered by    Tawanna Solo    Date: 03/04/2019        Time: 03:58 PM    Caller    Windle Foots    ------            Reason    New Refill Request            Message                      ***Start of Medication List: ***       hydrochlorothiazide-lisinopril 25 mg-20 mg 1 tab(s)  orally once a day   with no refill(s)      ***End of Med List ***       Comments:  Please confirm with [facility] that this can be refilled for90 days asap.            thank you            Randa Evens                 Action Taken                      [facility]  03/04/2019 4:09:34 PM > sent.                Refills    Refill hydrochlorothiazide-lisinopril tablet, 25 mg-20 mg, orally,  90 Tablet, 1 tab(s), once a day, 90 days, Refills=3    ------          * * *                ---          * * *        Provider: [name] 03/04/2019    ---    Note generated by eClinicalWorks EMR/PM Software ([email or web address])

## 2019-03-04 NOTE — Progress Notes (Signed)
* * *      Minchew, Burk M **DOB:** [birth year] (58 yo M) **Acc No.** [id number] **DOS:**  03/04/2019    ---        [name]**    ------    31 Y old Male, DOB: [birth year]    [address] [place], [place], [place] [address]    Home: [phone]    Provider: [name]        * * *    Web Encounter    ---    Answered by    Tawanna Solo    Date: 03/04/2019        Time: 03:58 PM    Caller    Anjelo Halberstam    ------            Reason    New Refill Request            Message                      ***Start of Medication List: ***       hydrochlorothiazide-lisinopril 25 mg-20 mg 1 tab(s)  orally once a day   with no refill(s)      ***End of Med List ***       Comments:  Please confirm with [facility] that this can be refilled for90 days asap.            thank you            Randa Evens                 Action Taken                      [facility]  03/04/2019 4:11:12 PM > duplicate.                    * * *                ---          * * *        Provider: [name] 03/04/2019    ---    Note generated by eClinicalWorks EMR/PM Software ([email or web address])

## 2019-03-11 ENCOUNTER — Ambulatory Visit: Admitting: Internal Medicine

## 2019-04-07 ENCOUNTER — Ambulatory Visit

## 2019-04-07 NOTE — Progress Notes (Signed)
* * *    Jordan [name], Jordan [name] **DOB:** 24-Jan-1961 (59 yo [name]) **Acc No.** [id number] **DOS:**  04/07/2019    ---        Jordan [name], Jordan [name]**    ------    52 Y old Male, DOB: [birth year]    Account Number: [id number]    [address], [place], [place]    Home: [phone]    Guarantor: [name] Insurance: [employer] Payer ID: [id number]    PCP: [name]    Appointment Facility: [facility]        * * *    04/07/2019    **Appointment Provider:** Kelli Churn, PA    ------      **Supervising Provider:** [name], [name]    ---        **Current Medications**    ---    Taking      * Vitamin B12 1000 mcg tablet 1 1/2 tab(s) orally once a day     ---    * Vitamin D3 2000 intl units tablet 2 cap(s) orally once a day     ---    * clonazepam 0.5 mg tablet 1 tab(s) orally every other day     ---    * fluoxetine 10 mg capsule 1 cap(s) orally once a day     ---    * Flovent HFA CFC free 110 mcg/inh aerosol 2 puff(s) inhaled 2 times a day, Notes: prn in allergy season     ---    * albuterol 90 mcg/inh powder 2 puff(s) inhaled every 6 hours as needed for wheezing, Notes: prn in allergy season     ---    * hydrochlorothiazide-lisinopril 25 mg-20 mg tablet 1 tab(s) orally once a day     ---    Medication List reviewed and reconciled with the patient    ---      Past Medical History    ---      L2-3 R-side disc bulge and osteophyte- Spinal Stenosis he has been  Injection stem cells, trying to avoid surgery. [name]        ---    Depression and Anxiety.        ---    HTN.        ---    Colonoscopy 2008 Dr [name] repeat in 2018 no polyps, stool cards 2019 negative  .        ---    2014 Elevated PSA Dr [name]. Status post biopsy.        ---    Insomnia anxiety related.        ---    Allergic rhinitis.        ---    Hx of H pylori.        ---    [facility] Mardee Postin NP; [name] therapist 1  x per month.        ---    Echo- normal function. EF 65 %. mildly dilated  ascending aorta... repeat in 6  months.[name]        ---    CT brain mucosal thickening of paranasal sinus and mild tonsillary ectopia.  pending neuro consult 08/14/16.        ---    2017 Hep C Screen.. Negative.        ---      **Family History**    ---      Father: deceased, HTN, Kidney failure, Dyalsis,  Heart failure, diagnosed  with Hypertension, Heart Disease    ---    Mother: alive 66 yrs, HTN, [place], diagnosed with Hypertension    ---    Daughter 1: alive 35 yrs    ---    Paternal Grand Father: deceased    ---    Paternal Grand Mother: deceased 65 yrs    ---    Maternal Grand Father: deceased, CHF, diagnosed with Heart Disease    ---    Maternal Grand Mother: deceased, cancer at age 59, diagnosed with Cancer    ---    1 sister(s) - healthy. 1 son(s) , 2 daughter(s) - healthy.    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    ---      **Social History**    ---    Tobacco Use    Are you a? _Never smoker_    Patient was counseled on the dangers of tobacco use: _01/11/2021_    Alcohol (TEXT ONLY): socially.    no Drug use.    Marital Status: married, 36 yrs married.    Children: sons:1 daughters:2.    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    no Exercise, should be doing more.    no Caffeine, due to high blood pressure.    Sexually active: Had sex in the last 12 months (vaginal,anal,oral): Yes, with:  Women only, Use Protection?: No, Prevention strategies discussed: Other, Have  you ever had an STD?: No.    Depression Screening I    Little interest or pleasure in doing things _Several days_    Feeling down, depressed, or hopeless _Several days_    Trouble falling or staying asleep, or sleeping too much _Several days_    Feeling tired or having little energy _Several days_    Poor appetite or overeating _Several days_    Feeling bad about yourself - or that you are a failure or have let yourself or  your family down _Several days_    Trouble concentrating on things, such as reading  the newspaper or watching  television _Several days_    Moving or speaking so slowly that other people could have noticed. Or the  opposite-being so figety or resltess that you have been moving around a lot  more than usual _Not at all_    Thoughts that you would be better off dead, or of hurting yourself _Several  days(Consider Suicide Assessment Risk)_    Total Score _8_    Interpretation _Mild Depression_    Depression Screening II    If you checked off any problems, how difficult have these problems made it for  you to do your work, take care of of things at home, or get along with other  people _Not difficult at all_      **Allergies**    ---      Tdap injection: swelling - Side Effects    ---    Cymbalta: constipation - Side Effects    ---        **Reason for Appointment**    ---      1\. bp check Wife aware    ---    2\. covid screened Pt replied NO    ---      **History of Present Illness**    ---    _Interim History_ :    59 yo male presents today for a 6 month BP recheck. BP at home are usually  around  122/78. Denies vision changes, headache, chest pain, palps or SOB.  Feeling well today.      **Vital Signs**    ---    Wt **176.8** , Ht **66** , BP  152/92  , **130/82** , BMI  **28.53** , Temp  97.2, HR **64** , O2 Sat **98%RA** .      **Examination**    ---    Denton Meek Examination:_    General Appearance:  NAD  ,  alert and oriented  .    Oral cavity:  clear  ,  moist mucus membranes  .    HEENT:  Head - NC/AT  ,  PERRLA  .    Neck, Thyroid :  supple  ,  no thyromegaly  ,  no lymphadenopathy  .    Heart:  RRR  ,  no murmurs  .    Lungs:  clear to auscultation bilaterally  ,  no wheezes/rhonchi/rales  .    Abdomen:  soft, NT/ND, BS present  .    Extremities:  no clubbing, no edema  .          **Assessments**    ---    1\. Essential hypertension - I10    ---    2\. Dilated aortic root - I77.810    ---    3\. Sleep apnea - G47.30    ---      **Treatment**    ---      **1\. Essential hypertension**     _LAB: Basic Metabolic Panel_     Melchor Amour 10/16/2019 08:44:11 AM >  This lab was reviewed by Melchor Amour on 10/16/2019 at 08:44 AM EDT    ------    _LAB: Urinalysis (Complete)_   Value    Reference Range    ---------    UA Clarity    Clear      Clear -    UA Spec Grav    1.016      1.003-1.030 -    ------------    UA pH    5.0      5.0-8.0 -    ------------    UA Protein    Negative      Negative - mg/dL    ------------    UA Glucose    Negative      Negative - mg/dL    ------------    UA Ketones    Negative      Negative - mg/dL    ------------    UA Blood    Negative      Negative -    ------------    UA Bili    Negative      Negative -    ------------    UA Urobilinogen    Negative      Negative -    ------------    UA Nitrite    Negative      Negative -    ------------    UA Leuk Est    Negative      Negative - WBC/uL    ------------    UA RBC    2      0-2 - /HPF    ------------    UA WBC    1      0-5 - /HPF    ------------    UA Bacteria    None      None -    ------------    UA Squamous Epi    <  1      0-5 - /HPF    ------------    UA Mucous    Few      None -    ------------    UA Color    Yellow      Yellow -    ------------      Kelli Churn 04/08/2019 09:03:06 AM > This lab was reviewed by Kelli Churn on 04/08/2019 at 09:03 AM EST    ------        Notes: BP at goal. Continue medication. Check lytes and kidney function. Low  sodium diet and exercise.        **2\. Dilated aortic root**    Clinical Notes: Echo 08/2017 stable, following Dr Alvino [place].        **3\. Sleep apnea**    Notes: Not currently on cpap machine or oral device. Long term complications  discussed.      **Visit Codes**    ---      01027 OFFICE/OUTPATIENT VISIT, ESTAB.[name]    ---      **Follow Up**    ---    10/17/2019, prn    **Appointment Provider:** Kelli Churn, PA    Electronically signed by [name] , [name] on  04/07/2019 at 01:32 PM EST    Sign off status: Completed        * * *        [facility]    [address]    [address]nd Floor    [place], [place] [address]    Tel: [phone]    Fax: [phone]              * * *          Progress Note: Kelli Churn, PA 04/07/2019    ---    Note generated by eClinicalWorks EMR/PM Software ([email or web address])

## 2019-04-08 LAB — HX URINALYSIS (COMPLETE)
CASE NUMBER: 2021011004528
HX UA BILIRUBIN: NEGATIVE — NL
HX UA BLOOD: NEGATIVE — NL
HX UA GLUCOSE: NEGATIVE — NL
HX UA KETONES: NEGATIVE — NL
HX UA LEUKOCYTE ESTERASE: NEGATIVE — NL
HX UA NITRITE: NEGATIVE — NL
HX UA PH: 5 — NL (ref 5.0–8.0)
HX UA PROTEIN: NEGATIVE — NL
HX UA RBC: 2 /HPF — NL (ref 0.0–2.0)
HX UA SPECIFIC GRAVITY: 1.016 — NL (ref 1.003–1.03)
HX UA SQUAMOUS EPITHELIAL: <1 — NL (ref 0.0–5.0)
HX UA UROBILINOGEN: NEGATIVE — NL
HX UA WBC: 1 /HPF — NL (ref 0.0–5.0)

## 2019-04-18 ENCOUNTER — Ambulatory Visit

## 2019-05-01 ENCOUNTER — Ambulatory Visit
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
## 2019-05-03 ENCOUNTER — Ambulatory Visit: Admitting: Cardiovascular Disease

## 2019-05-07 ENCOUNTER — Ambulatory Visit: Admitting: Internal Medicine

## 2019-05-07 NOTE — Progress Notes (Signed)
* * *      Guile, Edrees M **DOB:** 05-31-1960 (59 yo M) **Acc No.** [id number] **DOS:**  05/07/2019    ---        [name]**    ------    66 Y old Male, DOB: 1960-12-25    [address] [place], [place], [place] [address]    Home: [phone]    Provider: [name]        * * *    Web Encounter    ---    Answered by    Jerlyn Ly    Date: 05/07/2019        Time: 10:46 AM    Caller    [name]    ------            Reason    Lab tests            Message                       Addressed To: Chester Holstein I completed my lab work at [facility] on 04/18/2019.  I do not se the results posted to my file.  Have the results been received as of yet?            Thank you                            Action Taken                      Corklin,Salina  05/07/2019 11:51:49 AM > duplicate encounter                    * * *                ---          * * *        Provider: [name] 05/07/2019    ---    Note generated by eClinicalWorks EMR/PM Software ([email or web address])

## 2019-05-07 NOTE — Progress Notes (Signed)
* * *      Jordan [name], Jordan [name] **DOB:** 1960-11-15 (59 yo [name]) **Acc No.** [id number] **DOS:**  05/07/2019    ---        Jordan [name]**    ------    47 Y old Male, DOB: 1960-12-13    [address] [place], [place], [place] [address]    Home: [phone]    Provider: [name]        * * *    Web Encounter    ---    Answered by    Jordan [name]    Date: 05/07/2019        Time: 10:46 AM    Caller    Jordan [name]    ------            Reason    Lab tests            Message                       Addressed To: Jordan [name] I completed my lab work at [facility] on 04/18/2019.  I do not se the results posted to my file.  Have the results been received as of yet?            Thank you                            Action Taken                      Corklin,Salina  05/07/2019 11:51:35 AM >      Rae Halsted  05/07/2019 12:27:07 PM >[facility]... they will be faxing over labs, please scan in when they come in thanks      Ruel Favors 05/07/2019 02:10:42 PM >  reassigned to [facility]                    * * *                ---          * * *        Provider: [name] 05/07/2019    ---    Note generated by eClinicalWorks EMR/PM Software ([email or web address])

## 2019-05-08 ENCOUNTER — Ambulatory Visit: Admitting: Cardiovascular Disease

## 2019-05-08 NOTE — Progress Notes (Signed)
* * *      Jordan [name], Jordan [name] **DOB:** 01/29/1961 (59 yo [name]) **Acc No.** [id number] **DOS:**  05/08/2019    ---       Jordan [name], Alcide Goodness**    ------    5 Y old Male, DOB: 14-Feb-1961    [address] [place], [place], [place] [address]    Home: [phone]    Provider: Roosvelt Maser, MD        * * *    Telephone Encounter    ---    Answered by  Genevie Ann Date: 05/08/2019       Time: 09:26 AM    Reason  was ov today not tv    ------            Message                     Noreene Larsson pt came in today to see Dr Alvino [place] - he did his note on the TV Visit but it was really an ov.                    * * *                ---          * * *         Provider: Roosvelt Maser, MD 05/08/2019    ---    Note generated by eClinicalWorks EMR/PM Software ([email or web address])

## 2019-05-08 NOTE — Progress Notes (Signed)
 * * *        [facility]**        ---    Kieth Brightly. Donnetta Simpers, MD [facility] Barbera Setters Byrd Hesselbach, MD [facility];    Darlyn Chamber, MD [facility] Roosvelt Maser, MD [facility] Shanon Brow, MD [facility]  Nile Dear. Audley Hose, MD [facility]    Kandis Mannan A. Karie Mainland, MD [facility] Johnell Comings. MacNaught, MD [facility] Mitchel Honour, MD Erma Pinto, MD [facility]    Colletta [place], MD Weston Anna, MD [facility] Charlesetta Shanks, MD Kerby Moors, NP    Tonna Corner, NP Danelle Earthly, NP Caroleen Hamman, NP        * * *     **Patient Name:** Jordan [name]  **Date:** 05/08/2019    ------     **DOB:** April 15, 1960     **Referring Provider:** [name]  **Appointment Provider:** Roosvelt Maser, MD        * * *    05/08/2019 Progress Notes: Roosvelt Maser, MD    ------    ---      Current Medications    ---    Taking    * lisinopril 20 mg tablet 1 tab(s) orally once a day    ---    * Prozac 20 mg capsule 1 cap(s) orally once a day 15mg     ---    * Klonopin 0.5 mg tablet 1 tab(s) orally 1 times a day    ---    * clonazepam 0.5 mg tablet 1 tab(s) orally Q.D.    ---    * Medication List reviewed and reconciled with the patient    ---     Past Medical History    ---     Dilated aorta    ---    ..2/21 & 6/19 &4/18: Echo 4-4.1cm [facility]    ---    HTN    ---    Depression/anxiety sees Psych    ---     Family History    ---     Father: deceased, died CHF/HTN, diagnosed with Hypertension, Heart Disease    ---    Mother: alive, HTN, Hypertension    ---     Social History    ---    no Tobacco Status: **never smoked**.    Advised to lose weight: Yes .    No Have you fallen in the past year?.    no ETOH.    Occupation: Curator on a golf course.     Allergies    ---     N.K.D.A.    ---      Reason for Appointment    ---     1\. 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    CV  risk 8% (2020)    I initially saw him in 2018    >>>>>>>>>>>>>>>>>>>>>>>>>>>>    Returns on 05/08/2019    Since last visit the patient had an Echo (2/21) showing an EF of 65 w/AscAo  4.1    He's been doing very well although is quite limited by back pain. He has been  seen at the [facility] and also has been to [facility] for stem cell injection which  helped entirely but he is still quite  limited. He is still working as a  Curator at [employer] course but has limited hours due to his back pain. His  back pain is caused a significant amount of anxiety and possibly depression    He has not had any chest discomfort or dyspnea with his limited ambulation    He has his blood pressure cuff today in the office which shows blood pressures  in the 120s over 60s but many readings in the 130s-140s over 90s. He is on 20  mg of lisinopril    EKG personally reviewed demonstrating normal sinus rhythm with minor  nonspecific T-wave changes    Patient had an unremarkable BMP in July 2020 for cholesterol: 205/64/127/70    Denies PND, chest tightness at rest, abdominal cramping, changes in sight or  smell, night sweats, hematuria, unexplained dramatic weight loss, polydipsia,  new nodules,        Assessment and Plan    Asymptomatic mildly dilated ascending aorta stable as of 2019 unchanged on  echo 2/21    HTN remains suboptimal and I will increase his lisinopril from 20 up to 30 mg  daily    Hyperlipidemia with a 10 year cardiovascular risk of 8.1%. Declined statin  therapy in the past and we again discussed this but he declines statin therapy  for now    I will see him back in 1 year with a repeat echo at that time.     Vital Signs    ---    HR 55, BP 132/92, Wt 173.8, Ht 5'7, BMI 33.94, Med Assist: jn, O2 Sat 98%.     Examination    ---     _.._ :    General Appearance Well developed and nourished, No Acute distress, Looks  stated age . ENMT Atraumatic, normocephalic, Dentition good, Trachea  is  midline. Neck No JVD, 2+ carotids without bruits. Heart Exam S1S2 normal, RRR,  PMI non-displaced, No S3, No S4. Murmurs No significant murmurs. Pulmonary  Clear to ausculatation bilaterally, No respiratory Distress;. GI system no  hepatosplenomegaly, no distention . Extremities No edema bilaterally.  Pyschiatric communicating appropriately . Peripheral pulses intact throughout.         Impression/Recommendations    ---      **1\. Aortic dilatation**    Increase lisinopril tablet, 30 mg, 1 tab(s), orally, once a day, 90 days, 90,  Refills 6    _IMAGING: **Echocardiogram (Ordered for 05/07/2020)_    Clinical Notes:    Plan of treatment for condition as noted.    .    ---        **2\. Essential Hypertension**    Clinical Notes:    Plan of treatment for condition as noted.    .        **3\. Hypercholesterolemia**    Clinical Notes:    As noted above.    .        **4\. Others**    Clinical Notes:    Therapy as above.    .      Diagnostic Imaging    ------    _Imaging: **EKG_    ---      Procedure Codes    ---     93000 IH    ---     Follow Up    ---    1 Year    Electronically signed by Lowella Fairy MD on 05/08/2019 at 09:26 AM EST    Sign off status: Completed        * * *  [facility]    [address] RESEARCH [address]    [place], [place] [address]    Tel: [phone]    Fax: [phone]              * * *         Patient: Jordan [name], Jordan [name] DOB: 1960/06/29 Progress Note: Roosvelt Maser, MD  05/08/2019    ---    Note generated by eClinicalWorks EMR/PM Software ([email or web address])

## 2019-05-08 NOTE — Progress Notes (Signed)
* * *      [name], [name] **DOB:** [birth year] (59 yo M) **Acc No.** [id number] **DOS:**  05/08/2019    ---       Jordan [name]**    ------    65 Y old Male, DOB: [birth year]    [address], [place], [place], [place] [address]    Home: [phone]    Provider: Roosvelt Maser, MD        * * *    Telephone Encounter    ---    Answered by  Alesia Richards Date: 05/08/2019       Time: 09:27 AM    Reason  04/2020 Echo    ------            Message                     pt will need echo 1 week prior to 05/06/20 apt with Jordan [name]                Action Taken                     Torres,Rebecca  08/16/2019 9:41:37 AM > appt made and mailed to pt.                    * * *                ---          * * *         Provider: Roosvelt Maser, MD 05/08/2019    ---    Note generated by eClinicalWorks EMR/PM Software ([email or web address])

## 2019-05-09 ENCOUNTER — Ambulatory Visit: Admitting: Internal Medicine

## 2019-05-09 ENCOUNTER — Ambulatory Visit

## 2019-05-09 NOTE — Progress Notes (Signed)
* * *    Jordan [name], Jordan [name] **DOB:** 10-18-1960 (59 yo [name]) **Acc No.** [id number] **DOS:**  05/09/2019    ---        Jordan [name]**    ------    62 Y old Male, DOB: [birth year]    [address] [place], [place], [place] [address]    Home: [phone]    Provider: Kelli Churn        * * *    Web Encounter    ---    Answered by    Kelli Churn    Date: 05/09/2019        Time: 01:44 PM    Reason    lab results    ------            Message                      glu 106 needs A1C                Action Taken                      Kelli Churn 05/09/2019 01:44:18 PM > Hi [name], your last bloodwork shows that your kidney function is normal which is great. I am not sure if you were fasting prior to blood draw but your blood sugar level was just slightly high. Just to be on the safe side, I ordered an A1C which screens you for diabetes. You do not need to fast for this blood test. It was sent electronically so you can go any lab. I will send to [facility], since that was where you had drawn last. BestRevonda Standard [name]                    * * *        **eMessages**   From:    Jordan [name]    ------    Created:    2019-05-09 15:13:18    Sent:    2019-05-09 15:13:42    Subject:    RE:lab results    Message:      [facility], your last bloodwork shows that your kidney function is normal  which is great. I am not sure if you were fasting prior to blood draw but your  blood sugar level was just slightly high. Just to be on the safe side, I  ordered an A1C which screens you for diabetes. You do not need to fast for  this blood test. It was sent electronically so you can go any lab. I will send  to [facility], since that was where you had drawn last. Best!- Jordan [name]                      * * *        ---        Reason for Appointment    ---      1\. Lab results    ---      Assessments    ---    1\. Glucose intolerance (impaired glucose tolerance) - R73.02 (Primary)    ---      Treatment    ---      **1\. Glucose intolerance  (impaired glucose tolerance)**    _LAB: HEMOGLOBIN A1C WITH MPG_    ---          * * *  Provider: Kelli Churn 05/09/2019    ---    Note generated by eClinicalWorks EMR/PM Software ([email or web address])

## 2019-05-24 ENCOUNTER — Ambulatory Visit: Admitting: Internal Medicine

## 2019-05-24 NOTE — Progress Notes (Signed)
* * *      Gander, Jordan [name] **DOB:** 1961-01-12 (59 yo [name]) **Acc No.** [id number] **DOS:**  05/24/2019    ---        Jordan [name]**    ------    34 Y old Male, DOB: Apr 16, 1960    [address] [place], [place], [place] [address]    Home: [phone]    Provider: [name]        * * *    Web Encounter    ---    Answered by    Rosalia Hammers    Date: 05/24/2019        Time: 09:17 AM    Caller    Jordan [name]    ------            Reason    Re:RE:lab results            Message                      Hi [name]            I will make an appointment for next week to have this test done.            Thank you            [name]                                    .[name]..................................................................-            To :[name]            From :[facility]            Sent :05/09/2019 03:13 PM            Subject :RE:lab results                        Hi [name], your last bloodwork shows that your kidney function is normal which is great. I am not sure if you were fasting prior to blood draw but your blood sugar level was just slightly high. Just to be on the safe side, I ordered an A1C which screens you for diabetes. You do not need to fast for this blood test. It was sent electronically so you can go any lab. I will send to [facility], since that was where you had drawn last. Best!- [name] PA                            Action Taken                      [facility]  05/25/2019 11:54:56 AM >      Kelli Churn 05/25/2019 06:57:37 PM >                     * * *                ---          * * *        Provider: [name] 05/24/2019    ---    Note generated by eClinicalWorks EMR/PM Software ([email or web address])

## 2019-05-30 ENCOUNTER — Ambulatory Visit: Admitting: Internal Medicine

## 2019-05-30 NOTE — Progress Notes (Signed)
* * *      [name], [name] **DOB:** 08-Mar-1961 (58 yo M) **Acc No.** [id number] **DOS:**  05/30/2019    ---        Jordan [name]**    ------    37 Y old Male, DOB: [birth year]    [address], [place], [place], [place] [address]    Home: [phone]    Provider: [name]        * * *    Telephone Encounter    ---    Answered by    Kelli Churn    Date: 05/30/2019        Time: 07:52 AM    Reason    lab result    ------            Message                      A1C 5.5                Action Taken                      Kelli Churn 05/30/2019 07:52:15 AM > Hi [name], your A1C is 5.5 which is in normal range. No prediabetes or diabetes, which is great! Best- [name] PA                    * * *        **eMessages**   From:    Huoth,Allison    ------    Created:    2019-05-30 07:53:56    Sent:      Subject:    RE:lab result    Message:      Hi [name], your A1C is 5.5 which is in normal range. No prediabetes or  diabetes, which is great! BestRevonda Standard PA                        ---          * * *        Provider: [name] 05/30/2019    ---    Note generated by eClinicalWorks EMR/PM Software ([email or web address])

## 2019-09-25 ENCOUNTER — Ambulatory Visit: Admitting: Internal Medicine

## 2019-09-25 NOTE — Progress Notes (Signed)
* * *      [name], [name] **DOB:** 07-13-1960 (59 yo M) **Acc No.** [id number] **DOS:**  09/25/2019    ---        [name]**    ------    32 Y old Male, DOB: [birth year]    [address] [place], [place], [place] [address]    Home: [phone]    Provider: [name]        * * *    Web Encounter    ---    Answered by    Rosalia Hammers    Date: 09/25/2019        Time: 01:42 PM    Caller    [name]    ------            Reason    Lab test            Message                       Addressed To: Zikeria Keough                    Hello,            my son has been diagnosed with Ankylosing Spondylitis and they are saying that is hereditary,  We are trying to find out who in the family he may have gotten it from.  Would it be possible that you could set up the lab work for me to be tested for this?            Thank you,                                         Action Taken                      Carlisle Beers, [place] 09/25/2019 03:40:37 PM > eMessage sent.  Patient has OV scheduled for 10/17/2019                    * * *        **eMessages**   From:    Carlisle Beers    ------    Created:    2019-09-25 15:47:34    Sent:      Subject:    RE:Lab test    Message:      Cheri Guppy have a scheduled office appointment with Dr. Ruffin Frederick on 10/17/2019.    It would be best to wait to discuss with her in person, as well as obtain  history from you, to review your possible symptoms and concerns for an  accurate assessment.    Lyndee Hensen                        ---          * * *        Provider: [name] 09/25/2019    ---    Note generated by eClinicalWorks EMR/PM Software ([email or web address])

## 2019-10-17 ENCOUNTER — Ambulatory Visit: Admitting: Internal Medicine

## 2019-10-17 NOTE — Progress Notes (Signed)
* * *    Jordan [name] **DOB:** 1960-10-16 (59 yo [name]) **Acc No.** [id number] **DOS:**  10/17/2019    ---        Jordan [name], Jordan [name]**    ------    70 Y old Male, DOB: [birth year]    Account Number: [id number]    [address] [place], [place]    Home: [phone]    Guarantor: Jordan [name] Insurance: [employer] Payer ID: [id number]    Appointment Facility: [facility]        * * *    10/17/2019    Progress Note: Jordan [name], [name]    ------    ---        **Current Medications**    ---    Taking      * lisinopril 30 mg tablet 1 tab(s) orally once a day     ---    * Vitamin B12 1000 mcg tablet 1 1/2 tab(s) orally once a day     ---    * Vitamin D3 2000 intl units tablet 2 cap(s) orally once a day     ---    * clonazePAM 0.5 mg tablet 1 tab(s) orally every other day     ---    * FLUoxetine 10 mg capsule 1 cap(s) orally once a day     ---    Not-Taking    * Flovent HFA CFC free 110 mcg/inh aerosol 2 puff(s) inhaled 2 times a day, Notes: prn in allergy season     ---    * albuterol 90 mcg/inh powder 2 puff(s) inhaled every 6 hours as needed for wheezing, Notes: prn in allergy season     ---    Discontinued    * hydrochlorothiazide-lisinopril 25 mg-20 mg tablet 1 tab(s) orally once a day     ---    Medication List reviewed and reconciled with the patient    ---      Past Medical History    ---      L2-3 R-side disc bulge and osteophyte- Spinal Stenosis he has been  Injection stem cells, trying to avoid surgery. [name]        ---    Depression and Anxiety.        ---    HTN.        ---    Colonoscopy 2008 Jordan [name] repeat in 2018 no polyps, stool cards 2019 negative  .        ---    2014 Elevated PSA Jordan [name]. Status post biopsy.        ---    Insomnia anxiety related.        ---    Allergic rhinitis.        ---    Hx of H pylori.        ---    PsychR[facility] [name] [name] Jordan [name]; Jordan [name] therapist 1  x per month.        ---    Echo- normal function. EF 65 %. mildly  dilated ascending aorta... repeat in 6  months.[name]        ---    CT brain mucosal thickening of paranasal sinus and mild tonsillary ectopia.  pending neuro consult 08/14/16.        ---    2017 Hep C Screen.. Negative.        ---      **Surgical History**    ---  Prostate Biopsy negative 2014    ---      **Family History**    ---      Father: deceased, HTN, Kidney failure, Dyalsis, Heart failure, diagnosed  with Hypertension, Heart Disease    ---    Mother: alive 81 yrs, HTN, [place], diagnosed with Hypertension    ---    Daughter 1: alive 80 yrs    ---    Paternal Grand Father: deceased    ---    Paternal Grand Mother: deceased 71 yrs    ---    Maternal Grand Father: deceased, CHF, diagnosed with Heart Disease    ---    Maternal Grand Mother: deceased, cancer at age 87, diagnosed with Cancer    ---    1 sister(s) - healthy. 1 son(s) , 2 daughter(s) - healthy.    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    ---      **Social History**    ---    Tobacco Use    Are you a? _Never smoker_    Patient was counseled on the dangers of tobacco use: _07/23/2021_    Alcohol (TEXT ONLY): socially.    no Drug use.    Marital Status: married, 36 yrs married.    Children: sons:1 daughters:2.    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    no Exercise, should be doing more.    no Caffeine, due to high blood pressure.    Sexually active: Had sex in the last 12 months (vaginal,anal,oral): Yes, with:  Women only, Use Protection?: No, Prevention strategies discussed: Other, Have  you ever had an STD?: No.      **Allergies**    ---      Tdap injection: swelling - Side Effects    ---    Cymbalta: constipation - Side Effects    ---      **Hospitalization/Major Diagnostic Procedure**    ---      Past Hospitalization Voluntary 3 days    ---      **Review of Systems**    ---    _CPE ROS_ :    no Visual change. no Hearing loss. no [name] symptoms. no Headaches. no Oral  lesions. no Shortness of breath.  no Chest pain. no Palpitations. no Back Pain.  no Dysuria. no Urinary frequency or incontinence. no Diarrhea or constipation.  no Rectal bleeding. no Weakness or numbness. no Lightheadedness or dizziness.  no Change in skin lesions. no Edema. no Difficulty with speech or ambulation.  no Arthralgias or myalgias. no Significant weight loss or gain. no Mood  changes. no Nausea or vomiting. no Reflux symptoms. no Abdominal Pain. no  Breast lumps or pain, nipple discharge.            **Reason for Appointment**    ---      1\. CPE    ---      **History of Present Illness**    ---    _Depression Screening_ :    PHQ-2 (2015 Edition)    Little interest or pleasure in doing things? _Not at all_    Feeling down, depressed, or hopeless? _Not at all_    Total Score _0_    _*Interim History_ :    59 year old male, PMH as noted below, presents for annual exam. Meds as per  list. Last labs reviewed, Patient is practicing social distancing, using mask,  denies any covid contact, family is well.  he saw Jordan [name] meds adjusted, BP  higher, diurectic removed...BP has been higher at home.      **Vital Signs**    ---    Wt **165** , Ht 66, BP  **150/110** , BMI  **26.63** , Temp **97.7** , HR  **56** , O2 Sat **99%RA** .      **Examination**    ---    _General Examination:_    General Appearance:  Well appearing, in NAD  .    Skin:  no abnormal lesions  .    Oral cavity:  clear  ,  moist mucus membranes  .    HEENT:  Head is atraumatic and normocephalic. Sclerae are anicteric.  Conjunctivae are not injected. Mucous membranes are moist, without oral  lesions. Nasal passages are clear. TMs are clear bilaterally  .    Neck, Thyroid :  Supple. Carotid upstroke is intact, without bruit, cervical  adenopathy, or thyromegaly noted  .    Heart:  Regular rate and rhythm, without murmur or click appreciated  .    Lungs:  clear to auscultation bilaterally  .    Abdomen:  Positive bowel sounds. Soft and nontender, without mass,  organomegaly, or  bruit  .    Male Genitourinary:  mild enlargement of prostate, no nodules  .    Extremities:  No cyanosis, clubbing, or edema  .    Peripheral pulses:  Exam reveals all pulses to be intact, without bruit  .    Neurologic Exam:  non-focal exam  .          **Assessments**    ---    1\. Routine medical exam - Z00.00 (Primary)    ---    2\. Essential hypertension - I10    ---    3\. Dilated aortic root - I77.810    ---    4\. Sleep apnea - G47.30    ---    5\. Encounter for screening for malignant neoplasm of colon - Z12.11    ---      **Treatment**    ---      **1\. Routine medical exam**    _LAB: stool cards_    _LAB: PSA (FREE AND TOTAL)_    _LAB: MICROALBUMIN, RANDOM URINE (W/CREATININE)_    _LAB: LIPID PANEL W/ DIRECT REFLEX TO LDL_    Clinical Notes: Patient was counseled regarding healthy lifestyle, healthy  diet and exercise at least 150-200 minutes a week. BMI goal for age less than  25. Immunizations reviewed with patient. Recommended flu shot yearly. Patient  advised to have regular dental and eyes exam. Covid precautions reviewed with  patient, continue mask use and contact us if symptomatic.    ---        **2\. Essential hypertension**    Start hydroCHLOROthiazide tablet, 25 mg, 1 tab(s), orally, once a day, 30  day(s), 30, Refills 11    _LAB: COMPREHENSIVE METABOLIC PANEL W/EGFR_    _LAB: FECAL GLOBIN BY IMMUNOCHEM (MEDICARE)_     Rae Halsted  05/05/2020 8:34:34 AM > This lab was reviewed by Rae Halsted on  05/05/2020 at 08:34 AM EST    ------        Clinical Notes: Add hctz back, I will call Jordan [name] to clarify med changes.        **3\. Dilated aortic root**    Clinical Notes: following cardiology.        **4\. Sleep apnea**    Clinical Notes: Using cpap doing ok.        **  5\. Encounter for screening for malignant neoplasm of colon**    _IMAGING: Colonoscopy_     Corklin, [place] 10/17/2019 07:56:20 AM >  order/notes faxed to [facility] for direct  book  colonoscopy, phone # given to patient so he/she can check on status of appt.  Patient will call me with any issues or if he/she hasn't heard from them  within 1-2 mos Mansur, Nathanial Rancher 05/11/2020 09:17:33 AM > Mansur, Nathanial Rancher 11/11/2020  03:40:26 PM > This DI was reviewed by Consuelo Pandy on 11/11/2020 at 15:40 PM  EDT    ------        Clinical Notes: patient understands colonoscopy is the gold standard for colon  cancer screen.. Stool cards given also in yhe interim    We will fax records to GI for booking, patient advised to call us back if he  does not hear from them in 2-3 weeks.    .      **Preventive Medicine**    ---      Counseling:    Exercise .    Seatbelts .    Bike helmet .    Skin Self Exams .    Sunscreen .    Testicular self exam .    ---      **Follow Up**    ---    2 weeks BP check, 6 months TM, 1 year [facility]    Electronically signed by Jordan [name] , [name] on 10/17/2019 at 10:43 AM EDT    Sign off status: Completed        * * *        [facility]    [address]    [address]    [place], [place] [address]    Tel: [phone]    Fax: [phone]              * * *          Progress Note: Jordan [name], [name] 10/17/2019    ---    Note generated by eClinicalWorks EMR/PM Software ([email or web address])

## 2019-11-17 ENCOUNTER — Ambulatory Visit

## 2019-11-17 NOTE — Progress Notes (Signed)
* * *    Jordan [name], Jordan [name] **DOB:** 04-16-1960 (59 yo [name]) **Acc No.** [id number] **DOS:**  11/17/2019    ---        Jordan [name], Jordan [name]**    ------    59 Y old Male, DOB: 15-Feb-1961    Account Number: [id number]    [address] [place], [place]    Home: [phone]    Guarantor: Jordan [name] Insurance: [employer] Payer ID: [id number]    PCP: [name]    Appointment Facility: [facility]        * * *    11/17/2019    **Appointment Provider:** Kelli Churn, PA    ------      **Supervising Provider:** [name], [name]    ---        **Current Medications**    ---    Taking      * lisinopril 30 mg tablet 1 tab(s) orally once a day     ---    * Vitamin B12 1000 mcg tablet 1 1/2 tab(s) orally once a day     ---    * Vitamin D3 2000 intl units tablet 2 cap(s) orally once a day     ---    * clonazePAM 0.5 mg tablet 1 tab(s) orally every other day     ---    * FLUoxetine 10 mg capsule 1 cap(s) orally once a day     ---    * hydroCHLOROthiazide 25 mg tablet 1 tab(s) orally once a day     ---    Discontinued    * Flovent HFA CFC free 110 mcg/inh aerosol 2 puff(s) inhaled 2 times a day, Notes: prn in allergy season     ---    * albuterol 90 mcg/inh powder 2 puff(s) inhaled every 6 hours as needed for wheezing, Notes: prn in allergy season     ---    Medication List reviewed and reconciled with the patient    ---      Past Medical History    ---      L2-3 R-side disc bulge and osteophyte- Spinal Stenosis he has been  Injection stem cells, trying to avoid surgery. Jordan [name]        ---    Depression and Anxiety.        ---    HTN.        ---    Colonoscopy 2008 Dr Jordan [name] repeat in 2018 no polyps, stool cards 2019 negative  .        ---    2014 Elevated PSA Dr Jordan [name]. Status post biopsy.        ---    Insomnia anxiety related.        ---    Allergic rhinitis.        ---    Hx of H pylori.        ---    [name] Mardee Postin NP; [name] therapist 1  x per month.         ---    Echo- normal function. EF 65 %. mildly dilated ascending aorta... repeat in 6  months.Jordan [name]        ---    CT brain mucosal thickening of paranasal sinus and mild tonsillary ectopia.  pending neuro consult 08/14/16.        ---    2017 Hep C Screen.. Negative.        ---      **  Social History**    ---    Tobacco Use    Are you a? _Never smoker_    Patient was counseled on the dangers of tobacco use: _08/23/2021_    Alcohol (TEXT ONLY): socially.    no Drug use.    Marital Status: married, 36 yrs married.    Children: sons:1 daughters:2.    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    no Exercise, should be doing more.    no Caffeine, due to high blood pressure.    Sexually active: Had sex in the last 12 months (vaginal,anal,oral): Yes, with:  Women only, Use Protection?: No, Prevention strategies discussed: Other, Have  you ever had an STD?: No.      **Allergies**    ---      Tdap injection: swelling - Side Effects    ---    Cymbalta: constipation - Side Effects    ---        **Reason for Appointment**    ---      1\. F/U    ---      **History of Present Illness**    ---    _*Interim History_ :    59 yo male presents today for a BP recheck. At CPE BP was elevated, he was  restarted on hctz. Reports at home BP ~ 120/73. Denies vision changes,  headache, chest pain, palps or SOB.      **Vital Signs**    ---    Wt 166, Ht 66, BP  144/94  ,  **Repeat:130/82** , BMI  **26.79** , Temp 97.3,  HR 57, O2 Sat 99%RA.      **Examination**    ---    Denton Meek Examination:_    General Appearance:  NAD  ,  alert and oriented  .    Oral cavity:  clear  ,  moist mucus membranes  .    HEENT:  Head - NC/AT  ,  PERRLA  ,  EOMI  .    Neck, Thyroid :  supple  ,  no thyromegaly  ,  no lymphadenopathy  .    Heart:  RRR  ,  no murmurs  .    Lungs:  clear to auscultation bilaterally  ,  no wheezes/rhonchi/rales  .    Extremities:  no clubbing, no edema  .          **Assessments**    ---    1\. Essential hypertension - I10    ---       **Treatment**    ---      **1\. Essential hypertension**    Continue hydroCHLOROthiazide tablet, 25 mg, 1 tab(s), orally, once a day, 30  day(s), 30, Refills 11    Notes: BP at goal. Continue medication. Low sodium diet, increase exercise. He  will connect if BP consistently >130/80 at home.    ---      **Follow Up**    ---    04/13/2020    **Appointment Provider:** Kelli Churn, PA    Electronically signed by [name] , [name] on 11/18/2019 at 08:36 AM EDT    Sign off status: Completed        * * *        [facility]    [address]    [address] Floor    [place], [place] [address]    Tel: [phone]    Fax: [phone]              * * *  Progress Note: Kelli Churn, [place] 11/17/2019    ---    Note generated by eClinicalWorks EMR/PM Software ([email or web address])

## 2020-01-21 ENCOUNTER — Ambulatory Visit: Admitting: Orthopaedic Surgery

## 2020-04-01 ENCOUNTER — Ambulatory Visit (HOSPITAL_BASED_OUTPATIENT_CLINIC_OR_DEPARTMENT_OTHER)

## 2020-04-13 ENCOUNTER — Ambulatory Visit: Admitting: Medical

## 2020-04-13 LAB — HX BASIC METABOLIC PANEL
CASE NUMBER: 2022018000753
HX ANION GAP: 2 — ABNORMAL LOW (ref 3.0–11.0)
HX BUN: 13 mg/dL — NL (ref 8.0–23.0)
HX CALCIUM LVL: 9.8 mg/dL — NL (ref 8.5–10.5)
HX CHLORIDE: 104 mmol/L — NL (ref 98.0–110.0)
HX CO2: 30 mmol/L — NL (ref 21.0–32.0)
HX CREATININE: 0.995 mg/dL — NL (ref 0.55–1.3)
HX GLUCOSE LVL: 106 mg/dL — NL (ref 70.0–110.0)
HX POTASSIUM LVL: 4.5 mmol/L — NL (ref 3.6–5.2)
HX SODIUM LVL: 136 mmol/L — NL (ref 136.0–146.0)

## 2020-04-13 LAB — HX URINALYSIS (COMPLETE)
CASE NUMBER: 2022018000754
HX UA BILIRUBIN: NEGATIVE — NL
HX UA GLUCOSE: NEGATIVE — NL
HX UA KETONES: NEGATIVE — NL
HX UA LEUKOCYTE ESTERASE: NEGATIVE — NL
HX UA NITRITE: NEGATIVE — NL
HX UA PH: 5 — NL (ref 5.0–8.0)
HX UA PROTEIN: NEGATIVE — NL
HX UA RBC: 1 /HPF — NL (ref 0.0–2.0)
HX UA SPECIFIC GRAVITY: 1.012 — NL (ref 1.003–1.03)
HX UA SQUAMOUS EPITHELIAL: <1 — NL (ref 0.0–5.0)
HX UA UROBILINOGEN: NEGATIVE — NL
HX UA WBC: <1 — NL (ref 0.0–5.0)

## 2020-04-13 LAB — HX GLOMERULAR FILTRATION RATE (ESTIMATED)
CASE NUMBER: 2022018000753
HX AFN AMER GLOMERULAR FILTRATION RATE: >90
HX NON-AFN AMER GLOMERULAR FILTRATION RATE: 82 mL/min/{1.73_m2}

## 2020-04-13 NOTE — Progress Notes (Signed)
* * *    Jordan [name], Jordan [name] **DOB:** 1961-03-01 (59 yo [name]) **Acc No.** [id number] **DOS:**  04/13/2020    ---        Jordan [name], Jordan [name]**    ------    71 Y old Male, DOB: [birth year]    Account Number: [id number]    [address] [place], [place]    Home: [phone]    Guarantor: Jordan [name] Insurance: [employer] Payer ID: [id number]    PCP: Jordan [name]    Appointment Facility: [name]        * * *    04/13/2020    **Appointment Provider:** Jordan Pandy, PA    ------      **Supervising Provider:** Jordan [name], [name]    ---        **Current Medications**    ---    Taking      * lisinopril 30 mg tablet 1 tab(s) orally once a day     ---    * Vitamin B12 1000 mcg tablet 1 1/2 tab(s) orally once a day     ---    * Vitamin D3 2000 intl units tablet 2 cap(s) orally once a day     ---    * clonazePAM 0.5 mg tablet 1 tab(s) orally every other day     ---    * FLUoxetine 20 mg capsule 1 cap(s) orally once a day     ---    * hydroCHLOROthiazide 25 mg tablet 1 tab(s) orally once a day     ---    Medication List reviewed and reconciled with the patient    ---      Past Medical History    ---      L2-3 R-side disc bulge and osteophyte- Spinal Stenosis he has been  Injection stem cells, trying to avoid surgery. [name]        ---    Depression and Anxiety.        ---    HTN.        ---    Colonoscopy 2008 Jordan Jordan [name] repeat in 2018 no polyps, stool cards 2019 negative  .        ---    2014 Elevated PSA Jordan [name]. Status post biopsy.        ---    Insomnia anxiety related.        ---    Allergic rhinitis.        ---    Hx of H pylori.        ---    [name]; Jordan [name] therapist 1  x per month.        ---    Echo- normal function. EF 65 %. mildly dilated ascending aorta... repeat in 6  months.; 04/2019 4.1 cm.        ---    CT brain mucosal thickening of paranasal sinus and mild tonsillary ectopia.  pending neuro consult 08/14/16.        ---    2017 Hep  C Screen.. Negative.        ---      **Social History**    ---    Tobacco Use  Are you a? Never smoker  , Patient was counseled on the dangers  of tobacco use: 04/13/2020.    Alcohol (TEXT ONLY): socially.    no Drug use.    Marital Status: married, 36 yrs married.  Children: sons:1 daughters:2.    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    no Exercise, should be doing more.    no Caffeine, due to high blood pressure.    Sexually active: Had sex in the last 12 months (vaginal,anal,oral): Yes, with:  Women only, Use Protection?: No, Prevention strategies discussed: Other, Have  you ever had an STD?: No.      **Allergies**    ---      Tdap injection: swelling - Side Effects    ---    Cymbalta: constipation - Side Effects    ---        **Reason for Appointment**    ---      1\. 6 mos follow up    ---      **History of Present Illness**    ---    _*Interim History_ :    60 yr old male, pmhx and meds per below, here for 6 month f/u. BP has been  labile    120/80 to 140/85. Sometimes misses HCTZ. He has f/u booked w/ Jordan Jordan [name] booked  04/2020. Dilated aortic root.    Mood stable w/ meds/ psych and good family support.      **Vital Signs**    ---    Wt **175** , Ht 66, BP  180/100  ,  **Repeat:142/82** , BMI  **28.24** , Temp  **97.1** , HR **58** , O2 Sat **98%RA** .      **Examination**    ---    Jordan [name] Examination:_    General Appearance:  NAD  ,  alert and oriented  .    Oral cavity:  clear  ,  moist mucus membranes  .    HEENT:  Head - NC/AT  ,  PERRLA  ,  EOMI  ,  TM's clear and flat bilaterally  ,  Oropharynx clear with MMM  .    Neck, Thyroid :  supple  ,  no thyromegaly  ,  no lymphadenopathy  .    Heart:  RRR  ,  no murmurs  .    Lungs:  clear to auscultation bilaterally  ,  no wheezes/rhonchi/rales  .    Abdomen:  soft, NT/ND, BS present  .    Extremities:  no clubbing, no edema  .    Neurologic Exam:  non-focal exam  .          **Assessments**    ---    1\. Essential hypertension - I10    ---    2\. Dilated  aortic root - I77.810    ---      **Treatment**    ---      **1\. Essential hypertension**    Stop hydroCHLOROthiazide tablet, 25 mg, 1 tab(s), orally, once a day    _LAB: Basic Metabolic Panel_     Jordan [name] 04/13/2020 05:24:22 PM >    ------    _LAB: Urinalysis (Complete)_   Jordan [name] 04/13/2020 05:24:22 PM >    ------        Notes: change to hctx/Lisin 20/12.5 2 tablets daily . f/u bp in 1 month,  sooner prn. dash diet, regular exercise.        **2\. Dilated aortic root**    Notes: he has f/u booked w/ Jordan Jordan [name] next month. needs tight control of his BP.        **3\. Others**    Stop lisinopril tablet, 30 mg, 1 tab(s), orally, once a day    Start hydrochlorothiazide-lisinopril  tablet, 12.5 mg-20 mg, 2 tablets, orally,  once a day, 90 day(s), 180 Tablet, Refills 3      **Follow Up**    ---    4 weeks BP check, sooner prn.    **Appointment Provider:** Jordan Pandy, PA    Electronically signed by Jordan [name] , [name] on 04/20/2020 at 12:36 PM EST    Sign off status: Completed        * * *        [name]    [address]    [address]    [place], [place] [address]    Tel: [phone]    Fax: [phone]              * * *          Progress Note: Jordan Pandy, PA 04/13/2020    ---    Note generated by eClinicalWorks EMR/PM Software ([email or web address])

## 2020-05-07 ENCOUNTER — Ambulatory Visit: Admitting: Cardiovascular Disease

## 2020-05-11 ENCOUNTER — Ambulatory Visit: Admitting: Medical

## 2020-05-11 ENCOUNTER — Ambulatory Visit

## 2020-05-11 LAB — HX URINALYSIS (COMPLETE)
CASE NUMBER: 2022046000971
HX UA BILIRUBIN: NEGATIVE — NL
HX UA BLOOD: NEGATIVE — NL
HX UA GLUCOSE: NEGATIVE — NL
HX UA KETONES: NEGATIVE — NL
HX UA LEUKOCYTE ESTERASE: NEGATIVE — NL
HX UA NITRITE: NEGATIVE — NL
HX UA PH: 7 — NL (ref 5.0–8.0)
HX UA PROTEIN: NEGATIVE — NL
HX UA RBC: <1 — NL (ref 0.0–2.0)
HX UA SPECIFIC GRAVITY: 1.012 — NL (ref 1.003–1.03)
HX UA SQUAMOUS EPITHELIAL: <1 — NL (ref 0.0–5.0)
HX UA UROBILINOGEN: NEGATIVE — NL
HX UA WBC: <1 — NL (ref 0.0–5.0)

## 2020-05-11 LAB — HX BASIC METABOLIC PANEL
CASE NUMBER: 2022046000970
HX ANION GAP: 3 — NL (ref 3.0–11.0)
HX BUN: 18 mg/dL — NL (ref 8.0–23.0)
HX CALCIUM LVL: 9.7 mg/dL — NL (ref 8.5–10.5)
HX CHLORIDE: 99 mmol/L — NL (ref 98.0–110.0)
HX CO2: 31 mmol/L — NL (ref 21.0–32.0)
HX CREATININE: 1.02 mg/dL — NL (ref 0.55–1.3)
HX GLUCOSE LVL: 113 mg/dL — ABNORMAL HIGH (ref 70.0–110.0)
HX POTASSIUM LVL: 4.1 mmol/L — NL (ref 3.6–5.2)
HX SODIUM LVL: 133 mmol/L — ABNORMAL LOW (ref 136.0–146.0)

## 2020-05-11 LAB — HX GLOMERULAR FILTRATION RATE (ESTIMATED)
CASE NUMBER: 2022046000970
HX AFN AMER GLOMERULAR FILTRATION RATE: >90
HX NON-AFN AMER GLOMERULAR FILTRATION RATE: 80 mL/min/{1.73_m2}

## 2020-05-11 NOTE — Progress Notes (Signed)
* * *    Jordan [name], Jordan [name] **DOB:** Dec 11, 1960 (59 yo [name]) **Acc No.** [id number] **DOS:**  05/11/2020    ---        Jordan [name], Jordan [name]**    ------    89 Y old Male, DOB: November 14, 1960    Account Number: [id number]    [address], [place], [place]    Home: [phone]    Guarantor: Jordan [name] Insurance: [employer] Payer ID: [id number]    PCP: [name]    Appointment Facility: [facility]        * * *    05/11/2020    **Appointment Provider:** Consuelo Pandy, PA    ------      **Supervising Provider:** [name], [name]    ---        **Current Medications**    ---    Taking      * Vitamin B12 1000 mcg tablet 1 1/2 tab(s) orally once a day     ---    * Vitamin D3 2000 intl units tablet 2 cap(s) orally once a day     ---    * clonazePAM 0.5 mg tablet 1 tab(s) orally every other day     ---    * FLUoxetine 20 mg capsule 1 cap(s) orally once a day     ---    * hydrochlorothiazide-lisinopril 12.5 mg-20 mg tablet 2 tablets orally once a day     ---    Medication List reviewed and reconciled with the patient    ---      Past Medical History    ---      L2-3 R-side disc bulge and osteophyte- Spinal Stenosis he has been  Injection stem cells, trying to avoid surgery. [name]        ---    Depression and Anxiety.        ---    HTN.        ---    Colonoscopy 2008 Dr [name] repeat in 2018 no polyps, stool cards 2019 negative  .        ---    2014 Elevated PSA Dr [name]. Status post biopsy.        ---    Insomnia anxiety related.        ---    Allergic rhinitis.        ---    Hx of H pylori.        ---    [facility] Mardee Postin NP; [name] therapist 1  x per month.        ---    Echo- normal function. EF 65 %. mildly dilated ascending aorta... repeat in 6  months.; 04/2019 4.1 cm.        ---    CT brain mucosal thickening of paranasal sinus and mild tonsillary ectopia.  pending neuro consult 08/14/16.        ---    2017 Hep C Screen.. Negative.        ---       **Surgical History**    ---      Prostate Biopsy negative 2014    ---      **Family History**    ---      Father: deceased, HTN, Kidney failure, Dyalsis, Heart failure, diagnosed  with Hypertension, Heart Disease    ---    Mother: alive 58 yrs, HTN, [place], diagnosed with Hypertension    ---  Daughter 1: alive 87 yrs    ---    Paternal Grand Father: deceased    ---    Paternal Grand Mother: deceased 73 yrs    ---    Maternal Grand Father: deceased, CHF, diagnosed with Heart Disease    ---    Maternal Grand Mother: deceased, cancer at age 66, diagnosed with Cancer    ---    1 sister(s) - healthy. 1 son(s) , 2 daughter(s) - healthy.    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    ---      **Social History**    ---    Tobacco Use  Are you a? Never smoker  , Patient was counseled on the dangers  of tobacco use: 05/11/2020.    Alcohol (TEXT ONLY): socially.    no Drug use.    Marital Status: married, 36 yrs married.    Children: sons:1 daughters:2.    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    no Exercise, should be doing more.    no Caffeine, due to high blood pressure.    Sexually active: Had sex in the last 12 months (vaginal,anal,oral): Yes, with:  Women only, Use Protection?: No, Prevention strategies discussed: Other, Have  you ever had an STD?: No.      **Allergies**    ---      Tdap injection: swelling - Side Effects    ---    Cymbalta: constipation - Side Effects    ---      **Hospitalization/Major Diagnostic Procedure**    ---      Past Hospitalization Voluntary 3 days    ---        **Reason for Appointment**    ---      1\. 32mo BP check    ---    2\. Colonoscopy-?    ---    3\. Stool cards-?    ---    4\. Covid 19 screening questions asked and patient answered no to all    ---      **History of Present Illness**    ---    _*Interim History_ :    60 yr old male, here for 1 month BP check. Lisinopril 30 mg and HCTZ was  changed to combo HCTZ/ Lisin 20/12.5 2  tablets daily.    BP has been better. BP running 109- 139/ 66-80    First 2 weeks had leg cramps, resolved now.    He missed appt w/ Dr Alvino Chapel, planning to reschedule.    Still struggling w/ anxiety. Following psych.    Clonazepam helps the most.      **Vital Signs**    ---    Wt **176** , Ht **66** , BP  150/92  ,  **Repeat:124/82** , BMI  **28.40** ,  Temp **98.2** , HR **57** , O2 Sat **96%RA** .      **Examination**    ---    Denton Meek Examination:_    General Appearance:  NAD  ,  alert and oriented  .    Oral cavity:  clear  ,  moist mucus membranes  .    HEENT:  Head - NC/AT  ,  PERRLA  ,  EOMI  ,  TM's clear and flat bilaterally  ,  Oropharynx clear with MMM  .    Neck, Thyroid :  supple  ,  no thyromegaly  ,  no lymphadenopathy  .    Heart:  RRR  ,  no murmurs  .    Lungs:  clear to auscultation bilaterally  ,  no wheezes/rhonchi/rales  .    Abdomen:  soft, NT/ND, BS present  .    Extremities:  no clubbing, no edema  .    Neurologic Exam:  non-focal exam  .          **Assessments**    ---    1\. Essential hypertension - I10    ---      **Treatment**    ---      **1\. Essential hypertension**    _LAB: Basic Metabolic Panel_     Laterrance Nauta 05/11/2020 05:50:26 PM >    ------    _LAB: Urinalysis (Complete)_   Jariana Shumard 05/11/2020 05:57:20 PM >    ------        Notes: BP improved here and on home readings. cont current meds, DASH diet and  exercise. He assures me he will reschedule w/ Dr Alvino Chapel for this a nd dilated  aortic root. check labs today to ensure electrolytes, renal function stable.      **Follow Up**    ---    cpe as planned , sooner prn.    **Appointment Provider:** Consuelo Pandy, PA    Electronically signed by [name] , [name] on 05/11/2020 at 07:41 PM EST    Sign off status: Completed        * * *        [facility]    [address]    [address]    [place], [place] [address]    Tel: [phone]    Fax: [phone]              * * *          Progress Note: Consuelo Pandy, PA 05/11/2020    ---    Note generated by eClinicalWorks EMR/PM Software ([email or web address])

## 2020-05-11 NOTE — Progress Notes (Signed)
* * *      Jordan [name], Jordan [name] **DOB:** Oct 31, 1960 (59 yo [name]) **Acc No.** [id number] **DOS:**  05/11/2020    ---        Jordan [name]**    ------    79 Y old Male, DOB: 03/10/1961    [address], [place], [place], [place] [address]    Home: [phone]    Provider: Consuelo Pandy        * * *    Web Encounter    ---    Answered by    Consuelo Pandy    Date: 05/11/2020        Time: 05:50 PM    Reason    sodium    ------            Message                      Na 133  glu 113-   we 05/11/2020                Action Taken                      Raelynne Ludwick, Nathanial Rancher 05/11/2020 05:50:58 PM >   pls let pt know his Na  is low 133.   I'd like to repeat in 1 week. thanks.      Jordan [name]  05/12/2020 9:16:15 AM >Hi Jordan [name]- You Sodium level was a little low- Nathanial Rancher would like to make sure that you are drinking enough and have the blood work repeated in 1 week, order has been mailed to you, thank [employer]- sent message                     * * *        **eMessages**   From:    Jordan [name]    ------    Created:    2020-05-12 14:26:16    Sent:      Subject:                      GN:[id number]        Message:                      Hi [name]- You Sodium level was a little low- Nathanial Rancher would like to make sure that you are drinking enough and have the blood work repeated in 1 week, order has been mailed to you, thank [employer]                      * * *        ---        Reason for Appointment    ---      1\. Sodium    ---      Assessments    ---    1\. Hyponatremia - E87.1 (Primary)    ---      Treatment    ---      **1\. Hyponatremia**    _LAB: Basic Metabolic Panel (Ordered for 05/18/2020)_    ---          * * *        Provider: Consuelo Pandy 05/11/2020    ---    Note generated by eClinicalWorks EMR/PM Software ([email or web address])

## 2020-05-18 ENCOUNTER — Ambulatory Visit: Admitting: Medical

## 2020-05-19 ENCOUNTER — Ambulatory Visit: Admitting: Medical

## 2020-05-19 ENCOUNTER — Ambulatory Visit

## 2020-05-19 LAB — HX GLOMERULAR FILTRATION RATE (ESTIMATED)
CASE NUMBER: 2022054000918
HX AFN AMER GLOMERULAR FILTRATION RATE: >90
HX NON-AFN AMER GLOMERULAR FILTRATION RATE: 88 mL/min/{1.73_m2}

## 2020-05-19 LAB — HX BASIC METABOLIC PANEL
CASE NUMBER: 2022054000918
HX ANION GAP: 6 — NL (ref 3.0–11.0)
HX BUN: 14 mg/dL — NL (ref 8.0–23.0)
HX CALCIUM LVL: 10 mg/dL — NL (ref 8.5–10.5)
HX CHLORIDE: 98 mmol/L — NL (ref 98.0–110.0)
HX CO2: 30 mmol/L — NL (ref 21.0–32.0)
HX CREATININE: 0.942 mg/dL — NL (ref 0.55–1.3)
HX GLUCOSE LVL: 102 mg/dL — NL (ref 70.0–110.0)
HX POTASSIUM LVL: 4.2 mmol/L — NL (ref 3.6–5.2)
HX SODIUM LVL: 134 mmol/L — ABNORMAL LOW (ref 136.0–146.0)

## 2020-06-22 NOTE — Progress Notes (Signed)
* * *        **  [name]    --- ---    71 Y old Male, DOB: 1960-10-29    [address] [place], [place], [place] [address]    Home: [phone]    Provider: Earnestine Mealing, MD        * * *    Telephone Encounter    ---    Answered by   Scharlene Corn  Date: 09/12/2016         Time: 10:58 AM    Caller   JOAN PTS WIFE    --- ---            Reason   CONSULT            Message                      SOONER CONSULT DUE TO DX: DILATED AORTIC ROOT             [phone]                Action Taken   LaBranche,Pamela 09/18/2016 3:25:55 PM > LMM with a new appt on  7/2 at 2:00pm with Dr. Ann Maki -- pl                * * *                ---          * * *          Patient: Jordan [name], Jordan [name] DOB: 1960-11-21 Provider: Earnestine Mealing, MD  09/12/2016    ---    Note generated by eClinicalWorks EMR/PM Software ([email or web address])

## 2020-06-22 NOTE — Progress Notes (Signed)
* * *        **  Jordan [name]    --- ---    69 Y old Male, DOB: Dec 08, 1960    [address] [place], [place], [place] [address]    Home: [phone]    Provider: Roosvelt Maser, MD        * * *    Telephone Encounter    ---    Answered by   Encarnacion Slates  Date: 09/25/2016         Time: 02:36 PM    Reason   Echo & Ann 09/2017    --- ---            Action Taken   [facility] 11/09/2016 10:11:52 AM > Booked Echo for 09/18/17  at 7:30am, ANN w/Dr. Alvino Chapel booked for 10/03/17 at 4:15pm, s/w patient wife                * * *                ---          * * *          Patient: Jordan [name], Jordan [name] DOB: 1961-01-31 Provider: Roosvelt Maser, MD  09/25/2016    ---    Note generated by eClinicalWorks EMR/PM Software ([email or web address])

## 2020-06-22 NOTE — Progress Notes (Signed)
* * *         [facility]**        ---    Lawernce Ion. Sindy Messing, MD [facility] Kieth Brightly. Donnetta Simpers, MD [facility] Barbera Setters Byrd Hesselbach, MD  [facility];    Darlyn Chamber, MD [facility] Roosvelt Maser, MD [facility] Shanon Brow, MD [facility]  Nile Dear. Audley Hose, MD [facility]    Kandis Mannan A. Karie Mainland, MD [facility] Johnell Comings. MacNaught, MD [facility] Mitchel Honour, MD Erma Pinto, MD [facility]    Colletta Maryland, MD Weston Anna, MD [facility] Kerby Moors, NP Maximino Greenland ,  NP        * * *     **Patient Name:** Jordan [name]   **Date:** 09/25/2016    --- ---     **DOB:** 1960/10/22     **Referring Provider:** [name]   **Appointment Provider:** Roosvelt Maser, MD        * * *    09/25/2016  Progress Notes: Roosvelt Maser, MD    --- ---    ---        Current Medications    ---    Taking     * lisinopril 20 mg tablet 1 tab(s) orally once a day    ---    * Prozac 20 mg capsule 1 cap(s) orally once a day    ---    * lamotrigine 25 mg tablet 1 tab(s) orally 2 times a day    ---    * Klonopin 0.5 mg tablet 1 tab(s) orally 3 times a day    ---    * B-12 1000 mcg tablet 1 tab(s) sublingually once a day    ---    * Vitamin D3 2000 intl units tablet 1 tab(s) orally once a day    ---    * Medication List reviewed and reconciled with the patient    ---      Past Medical History    ---      Dilated aorta    ---    ..4/18: Echo 4cm    ---    HTN    ---    Depression/anxiety sees Psych    ---      Family History    ---      Father: deceased, died CHF/HTN, diagnosed with Hypertension, Heart Disease    ---    Mother: alive, HTN, diagnosed with Hypertension    ---      Social History    ---    no Tobacco Status: **never smoked**.    Advised to lose weight: Yes .    No Have you fallen in the past year?.    no ETOH.    Occupation: Curator on a golf course.      Allergies    ---      N.K.D.A.    ---        Reason for Appointment    ---      1\. Booked by pts wife per Dr. Ruffin Frederick PCP Dx: Dilated aortic root;wanted  sooner;made sooner, lmm    ---       History of Present Illness    ---     _History of Present Illness_ :    I was kindly asked to see the patient for Dr. Ruffin Frederick in regards to a dilated  thoracic aorta measuring 4cm on Echo 4/18    He states his BP has been relatively well  controlled until over the past 6-9  months when he has been dealing with depression and anxiety. He feels that  after starting lamotrigine 100 mg daily his blood pressure was more difficult  to control. His blood pressure readings were up into the 170s at times. His  lamotrigine was decreased down to 50 mg and his BP has been better controlled  although he still notes occasional readings into the 150+ range although  states most of the readings are in the 110-120s.    However he does endorse some degree of noncompliance and forgets to take his  lisinopril 20 mg on a consistent basis. He acknowledges this as a problem.    He never has chest discomfort and is active as a Curator on a golf course.    He does have significant back pain but does not take a significant number of  nonsteroidals due to injections he has been receiving. If he has significant  pain he typically takes Tylenol. He does not drink nor has he gained a  significant amount of weight recently.    Because of his difficult to control BP and echocardiogram was performed  demonstrating a mildly dilated ascending aorta at 4 cm.    EKG personally reviewed on 09/25/16 showing NSR with non-specific ST changes    ROS: no sob, chest pain, palpitations, PND, edema, new focal weakness or  slurring of speech, N/v/d, hemoptysis, fevers/chills, rashes, dramatic weight  loss, abdominal pain, polyuria. All other systems reviewed and were negative.      Vital Signs    ---    HR 61, BP 140/72, Wt 166, Ht 5'7, BMI 32.42, Med Assist: em.      Examination    ---     _.._ :    General Appearance Well developed and nourished, No Acute distress, Looks  stated age . Eyes Sclera are anicteric, Conjunctiva Pink, Pupils equal and  Round. ENMT  Atraumatic, normocephalic, Dentition good, Trachea is midline.  Neck No JVD, 2+ carotids without bruits. Heart Exam S1S2 normal, RRR, PMI non-  displaced, No S3, No S4. Murmurs No significant murmurs. Pulmonary Clear to  ausculatation bilaterally, No respiratory Distress;. GI system soft, non-  tender, positive bowel sounds, No hepatosplenomegaly, No bruits, No pulsatile  masses. Extremities No edema bilaterally. Neurology Grossly non-focal. Skin  intact without rashes or bruises, warm and dry. Pyschiatric appropriate, Alert  and oriented x 3. Peripheral pulses intact throughout.          Impression/Recommendations    ---       **1\. Aortic dilatation**    _IMAGING: **Echocardiogram (Ordered for 09/25/2017)_    Clinical Notes: Minimally dilated aorta and ascending of hypertension.  Reviewed findings and explained risk for dissection at this point is low but  he should have repeat echocardiograms once a year to monitor his aortic size.  At a diameter of 5.5 cm risk for rupture will be elevated and he would benefit  from ascending thoracic aorta repair. I reviewed the importance of blood  pressure control to minimize the risk for further aortic dilatation.    Currently his BP is not optimal although this may in large part be due to  intermittent compliance. He does not wish to increase his lisinopril at this  time and wants to work on getting his compliance which we discussed at length.  This seems more than reasonable. He will call me if he notes persistently  elevated readings.    I will plan to  see him back in one year with repeat echocardiogram at that  time.After 2 or 3 annual echocardiograms, if his aortic diameter has not  changed significantly we will plan to see him every other year unless he needs  to be seen sooner for stricter BP control.    ---        Diagnostic Imaging    --- ---    Ardine Bjork: **EKG_    ---       Procedure Codes    ---      93000 IH    ---      Follow Up    ---    1 Year    Electronically  signed by Lowella Fairy MD on 09/25/2016 at 02:40 PM EDT    Sign off status: Completed        * * *        [facility]    [address] RESEARCH [address]    [place], [place] [address]    Tel: [phone]    Fax: [phone]              * * *          Patient: SEVAN, MCBROOM DOB: [birth year] Progress Note: Roosvelt Maser, MD  09/25/2016    ---    Note generated by eClinicalWorks EMR/PM Software ([email or web address])

## 2020-06-23 LAB — EXTERNAL COLONOSCOPY: Colonoscopy: 7

## 2020-11-05 ENCOUNTER — Ambulatory Visit (HOSPITAL_BASED_OUTPATIENT_CLINIC_OR_DEPARTMENT_OTHER)

## 2020-11-11 ENCOUNTER — Other Ambulatory Visit (HOSPITAL_BASED_OUTPATIENT_CLINIC_OR_DEPARTMENT_OTHER): Admitting: Medical

## 2020-11-11 ENCOUNTER — Other Ambulatory Visit: Admit: 2020-11-11 | Payer: PRIVATE HEALTH INSURANCE | Primary: Internal Medicine

## 2020-11-11 ENCOUNTER — Ambulatory Visit: Admitting: Medical

## 2020-11-11 ENCOUNTER — Ambulatory Visit

## 2020-11-11 ENCOUNTER — Other Ambulatory Visit

## 2020-11-11 DIAGNOSIS — Z0000 Encounter for general adult medical examination without abnormal findings: Secondary | ICD-10-CM

## 2020-11-11 LAB — PSA TOTAL, SCREEN: PSA: 1.98 ng/mL (ref 0.00–4.00)

## 2020-11-11 LAB — URINALYSIS
Bilirubin, Ur: NEGATIVE
Blood, Ur: NEGATIVE
Glucose,Ur: NEGATIVE mg/dL
Ketones, Ur: NEGATIVE mg/dL
Leukocyte Esterase, Ur: NEGATIVE WBC/uL
Nitrite, Ur: NEGATIVE
Protein,Ur: NEGATIVE mg/dL
Specific Gravity, Ur: 1.015 (ref 1.003–1.030)
Urobilinogen, Ur: NEGATIVE
pH, Ur: 7 (ref 5.0–8.0)

## 2020-11-11 LAB — COMPREHENSIVE METABOLIC PANEL
ALT: 46 U/L (ref 0–55)
AST: 29 U/L (ref 6–42)
Albumin: 4.3 g/dL (ref 3.2–5.0)
Alkaline phosphatase: 51 U/L (ref 30–130)
Anion Gap: 6 mmol/L (ref 3–14)
BUN: 16 mg/dL (ref 6–24)
Bilirubin, total: 0.7 mg/dL (ref 0.2–1.2)
CO2 (Bicarbonate): 30 mmol/L (ref 20–32)
Calcium: 9.5 mg/dL (ref 8.5–10.5)
Chloride: 99 mmol/L (ref 98–110)
Creatinine: 1.05 mg/dL (ref 0.55–1.30)
Glucose: 78 mg/dL (ref 70–110)
Potassium: 4.2 mmol/L (ref 3.6–5.2)
Protein, total: 7.2 g/dL (ref 6.0–8.4)
Sodium: 135 mmol/L (ref 135–146)
eGFRcr: 81 mL/min/{1.73_m2} (ref >=60)

## 2020-11-11 LAB — LIPID PANEL
Cholesterol: 219 mg/dL — ABNORMAL HIGH (ref <=200)
HDL cholesterol: 57 mg/dL (ref >=40)
LDL cholesterol, calculated: 147 mg/dL — ABNORMAL HIGH (ref 0–130)
Triglycerides: 76 mg/dL (ref <=150)

## 2020-11-11 NOTE — Progress Notes (Signed)
* * *    [name], [name] **DOB:** May 23, 1960 (60 yo M) **Acc No.** [id number] **DOS:**  11/11/2020    ---        Jordan [name], Jordan [name]**    ------    21 Y old Male, DOB: May 02, 1960    Account Number: [id number]    [address], [place], [place]    Home: [phone]    Guarantor: Jordan [name] Insurance: [employer] Payer ID: [id number]    PCP: Jordan [name]    Appointment Facility: [facility]        * * *    11/11/2020    **Appointment Provider:** Jordan Pandy, PA    ------      **Supervising Provider:** Jordan [name], M.D.    ---        **Current Medications**    ---    Taking      * Vitamin B12 1000 mcg tablet 1 1/2 tab(s) orally once a day     ---    * Vitamin D3 2000 intl units tablet 2 cap(s) orally once a day     ---    * clonazePAM 0.5 mg tablet 1 tab(s) orally every other day     ---    * FLUoxetine 20 mg capsule 1 cap(s) orally once a day     ---    * hydrochlorothiazide-lisinopril 12.5 mg-20 mg tablet 2 tablets orally once a day     ---    Medication List reviewed and reconciled with the patient    ---      Past Medical History    ---      L2-3 R-side disc bulge and osteophyte- Spinal Stenosis he has been  Injection stem cells, trying to avoid surgery. Jordan [name] Kitchen        ---    Depression and Anxiety.        ---    HTN.        ---    Colonoscopy 2008 Jordan [name] repeat in 2018 no polyps, stool cards 2019 negative  ; 2022 Tubular adenoma repeat in 7 yrs..        ---    2014 Elevated PSA Jordan [name]. Status post biopsy.        ---    Insomnia anxiety related.        ---    Allergic rhinitis.        ---    Hx of H pylori.        ---    [name] Jordan Postin NP; Jordan [name] therapist 1  x per month.        ---    Echo- normal function. EF 65 %. mildly dilated ascending aorta... repeat in 6  months.; 04/2019 4.1 cm.        ---    CT brain mucosal thickening of paranasal sinus and mild tonsillary ectopia.  pending neuro consult 08/14/16.        ---    2017 Hep C  Screen.. Negative.        ---      **Surgical History**    ---      Prostate Biopsy negative 2014    ---      **Family History**    ---      Father: deceased, HTN, Kidney failure, Dyalsis, Heart failure, diagnosed  with Hypertension, Heart Disease    ---    Mother: alive 18 yrs, HTN, [place]- moved  home, diagnosed with Hypertension    ---    Son 1: alive    ---    Daughter 1: alive 59 yrs    ---    Daughter 2: alive    ---    Paternal Grand Father: deceased    ---    Paternal Grand Mother: deceased 80 yrs    ---    Maternal Grand Father: deceased, CHF, diagnosed with Heart Disease    ---    Maternal Grand Mother: deceased, cancer at age 44 intestinal, diagnosed with  Cancer    ---    Sister 1: alive, spine issues.    ---    1 sister(s) - healthy. 1 son(s) , 2 daughter(s) - healthy.    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    ---      **Social History**    ---    Tobacco Use  Are you a? Never smoker  , Patient was counseled on the dangers  of tobacco use: 11/11/2020.    Alcohol  Did a drink containing alcohol in the last year? Yes  , How often did  you have a drink containing alcohol within the last year? Two to four times a  month (2 points)  , How many drinks did you have containing alcohol on a  typical day in the last year? 1 or 2 (0 points)  , How often did you have six  or more drinks containing alcohol within the last year? Never (0 points)  ,  Points 2  , Interpretation Negative.    Alcohol (TEXT ONLY): socially.    no Drug use.    Marital Status: married, 36 yrs married.    Children: sons:1 daughters:2.    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    no Exercise, active job walking for exercise, stretching w/ inversion table..    no Caffeine, due to high blood pressure.    Sexually active: Had sex in the last 12 months (vaginal,anal,oral): Yes, with:  Women only, Use Protection?: No, Prevention strategies discussed:  Other, Have  you ever had an STD?: No.    Travel ouside [place]: none planned.Jordan [name] Kitchen      **Allergies**    ---      Tdap injection: swelling - Side Effects    ---    Cymbalta: constipation - Side Effects    ---      **Hospitalization/Major Diagnostic Procedure**    ---      Past Hospitalization Voluntary 3 days    ---      **Review of Systems**    ---    _F/U ROS_ :    no Visual change. no Hearing loss. no [name] symptoms. no Headaches. no Oral  lesions. no Shortness of breath. no Chest pain. no Palpitations. no Back Pain.  no Dysuria. no Urinary frequency or incontinence. no Diarrhea or constipation.  no Rectal bleeding. no Weakness or numbness. no Lightheadedness or dizziness.  no Change in skin lesions. no Edema. no Difficulty with speech or ambulation.  no Arthralgias or myalgias. no Significant weight loss or gain. no Mood  changes. no Nausea or vomiting. no Reflux symptoms. no Abdominal Pain.    _Additional Male CPE ROS_ :    no Inguinal hernias. no Testicular masses. no Urinary hesitancy. no Penile  discharge. no Erectile dysfunction.    mood stable.        **  Reason for Appointment**    ---      1\. CPE    ---    2\. Shingrix-not done.. insurance states it will not be covered until age 50    ---    3\. Colonoscopy results received...-AC    ---      **History of Present Illness**    ---    _Depression Screening_ :    PHQ-2 (2015 Edition) Little interest or pleasure in doing things? Not at all,  Feeling down, depressed, or hopeless? Not at all, Total Score 0\.    _*Interim History_ :    60 yr old male presents today for his annual exam. Meds as per list. Last labs  reviewed. He is feeling well today. See also ROS.    daughter getting married in 12/2020.      **Vital Signs**    ---    Wt 169, Ht **66** , BP  144/90  ,  **Repeat:128/80** , BMI  **27.27** , HR 65,  O2 Sat 97%RA.      **Examination**    ---    _General Examination:_    General Appearance:  Well appearing, in NAD  .    Skin:  No abnormal lesions  .     HEENT:  Head is atraumatic and normocephalic. PERRL. Sclerae are anicteric.  Conjunctivae are not injected. Mucous membranes are moist, without oral  lesions. Nasal passages are clear. TMs are clear bilaterally  .    Neck, Thyroid :  Supple. Carotid upstroke is intact, without bruit, cervical  adenopathy, or thyromegaly noted  .    Heart:  Regular rate and rhythm, without murmur or click appreciated  .    Lungs:  clear to auscultation bilaterally  ,  no wheezes/rhonchi/rales  .    Abdomen:  Positive bowel sounds. Soft and nontender, without mass,  organomegaly, or bruit  .    Male Genitourinary:  declined.  .    Extremities:  No cyanosis, clubbing, or edema  .    Peripheral pulses:  Exam reveals all pulses to be intact, without bruit  .    Neurologic Exam:  non-focal exam  .          **Assessments**    ---    1\. Annual physical exam - Z00.00 (Primary)    ---    2\. Essential hypertension - I10    ---    3\. Dilated aortic root - I77.810    ---    4\. Anxiety - F41.9    ---      **Treatment**    ---      **1\. Annual physical exam**    _LAB: COMPREHENSIVE METABOLIC PANEL_     Mkayla Steele 11/12/2020 09:13:44  AM >    ------    _LAB: LIPID PANEL_   Anetra Czerwinski 11/12/2020 09:13:16 AM >    ------    _LAB: PSA, SCREEN_   Sanna Porcaro 11/12/2020 09:12:42 AM >    ------    _LAB: URINALYSIS WITH REFLEX MICROSCOPIC_   Oberon Hehir 11/12/2020 09:13:31  AM >    ------        Notes:    Focus on healthy diet and lifestyle. Immunizations up to date. Annual flu  shot. Colonoscopy UTD PSA. Call for concerns.    .        **2\. Essential hypertension**    Notes: stable and at goal. Discussed importance of keeping blood pressure well  controlled. DASH diet. Take medications  daily as prescribed. Regular  cardiovascular exercise 30 min. most days of the week.        **3\. Dilated aortic root**    Notes: check cardio for last echo and appt. he was due in 04/2020. last we have  in chart is from 04/2019. no sxs.         **4\. Anxiety**    Notes: stable w/ meds. follows Jordan.      **Preventive Medicine**    ---      Counseling: Exercise . Seatbelts . Bike helmet . Calcium/Vitamin D  Supplement . Skin Self Exams . Sunscreen . Testicular self exam .    ---      **Follow Up**    ---    5 M and 1 yr cpe MC, sooner prn.    **Appointment Provider:** Jordan Pandy, PA    Electronically signed by Jordan [name] , M.D. on 11/14/2020 at 08:20 PM EDT    Sign off status: Completed        * * *        [facility]    [address]    [address] Floor    [place], [place] [address]    Tel: [phone]    Fax: [phone]              * * *          Progress Note: Jordan Pandy, PA 11/11/2020    ---    Note generated by eClinicalWorks EMR/PM Software ([email or web address])

## 2020-11-12 ENCOUNTER — Ambulatory Visit: Admitting: Medical

## 2020-11-12 ENCOUNTER — Other Ambulatory Visit (INDEPENDENT_AMBULATORY_CARE_PROVIDER_SITE_OTHER): Admitting: Internal Medicine

## 2020-11-12 NOTE — Progress Notes (Signed)
* * *    [name], [name] **DOB:** [birth year] (60 yo M) **Acc No.** [id number] **DOS:**  11/12/2020    ---        Jordan [name]**    ------    68 Y old Male, DOB: 20-May-1960    [address] [place], [place], [place] [address]    Home: [phone]    Provider: Consuelo [name]        * * *    Web Encounter    ---    Answered by    Jordan [name]    Date: 11/12/2020        Time: 02:35 PM    Reason    cholesterol-    ------            Message                      TC 219  HDL 57 LDL 147  trig 76  10 yr risk score 13.8%   mod statin indicated. we 11/12/2020                Action Taken                      Jordan [name], Jordan [name] 11/12/2020 02:36:04 PM >  pls let him now above.    cholesterol is high. statinis indicaed. if wiling, pls  send rx.  pls review proper use, se's and low fat diet., exercise .  thanks.      [name],Jordan , LPN 9/62/9528 4:13:24 PM > LM to call office      [name],Jordan , LPN 06/26/270 5:36:64 PM > BQ can you try Monday? Jordan Solian, RN 11/15/2020 12:05:24 PM > LM on VM requesting callback to discuss      Jordan Beers, RN 11/16/2020 09:25:10 AM > Left another VM requesting callback.  eMessage sent      Jordan Beers, RN 11/18/2020 09:26:38 AM > Left another VM message.  Letter sent.   TM...FYI      Jordan [name] 11/18/2020 11:30:50 AM > aware                Refills    Start Atorvastatin Calcium tablet, 20 mg, orally, 30, 1 tab(s),  once a day, 30 day(s), Refills=11    ------          * * *        **eMessages**   From:    [name],Jordan [name]    ------    Created:    2020-11-16 09:28:13    Sent:    2020-11-16 09:31:06    Subject:                      [id number]-        Message:                      Jordan [name],                                    We have left you a couple of messages to review some recent lab results and recommendations.                                    Please give [place] a  call at your convenience.                                    Looking forward to hearing from you.                                     Betsy,RN                                          ---          * * *        Provider: Chou Busler, Jordan [name] 11/12/2020    ---    Note generated by eClinicalWorks EMR/PM Software ([email or web address])

## 2020-11-12 NOTE — Progress Notes (Signed)
* * *      Callari, Mattson M **DOB:** [birth year] (60 yo M) **Acc No.** [id number] **DOS:**  11/12/2020    ---        [name]**    ------    64 Y old Male, DOB: [birth year]    [address] [place], [place], [place] [address]    Home: [phone]    Provider: Consuelo Pandy        * * *    Web Encounter    ---    Answered by    Consuelo Pandy    Date: 11/12/2020        Time: 12:28 PM    Reason    ECHO BOOKED.[name]    ------            Message                      pt was due to have echo and f/u w/ Dr Alvino Chapel in 04/2020 for f/u artic dilation. pls  check w/ cardio if he went and get notes, if he hasn't been, needs to schedule. thanks.                Action Taken                      Gerrell Tabet, Nathanial Rancher 11/12/2020 12:28:44 PM >  thanks.      Corklin,Salina  11/12/2020 1:12:28 PM > pt saw Dr Alvino Chapel in [facility] is faxing note, no echo done/booked...      Echo booked 11/17/20 at [facility], working on [place]      Corklin,Salina  11/12/2020 1:23:32 PM > called [employer] [phone], no pa required Ref # [id number]      Corklin,Salina  11/12/2020 1:25:41 PM > called pt to notify of appt.. he is watching his grandson, will call back for appt information.  Pt aware booked next wk so will call back sooner than later for appt info.      Corklin,Salina  11/15/2020 7:52:32 AM > [facility] note received.. notify pt of echo appt      Corklin,Salina  11/15/2020 10:38:27 AM > pt aware.Lorain Childes TM      Albino Bufford 11/15/2020 01:04:32 PM > thanks.                    * * *                ---          * * *        Provider: Kwali Wrinkle, Nathanial Rancher 11/12/2020    ---    Note generated by eClinicalWorks EMR/PM Software ([email or web address])

## 2020-11-17 ENCOUNTER — Other Ambulatory Visit

## 2020-11-17 ENCOUNTER — Encounter: Admit: 2020-11-17 | Discharge: 2020-11-17 | Payer: PRIVATE HEALTH INSURANCE | Primary: Internal Medicine

## 2020-11-17 DIAGNOSIS — I77810 Thoracic aortic ectasia: Secondary | ICD-10-CM

## 2021-05-19 ENCOUNTER — Encounter (INDEPENDENT_AMBULATORY_CARE_PROVIDER_SITE_OTHER): Admitting: Medical

## 2021-05-19 ENCOUNTER — Other Ambulatory Visit

## 2021-05-19 ENCOUNTER — Ambulatory Visit
Admit: 2021-05-19 | Discharge: 2021-05-19 | Payer: PRIVATE HEALTH INSURANCE | Attending: Medical | Primary: Internal Medicine

## 2021-05-19 VITALS — BP 126/80 | HR 68 | Ht 64.0 in | Wt 182.0 lb

## 2021-05-19 DIAGNOSIS — I119 Hypertensive heart disease without heart failure: Secondary | ICD-10-CM

## 2021-05-19 MED ORDER — fluticasone (Flovent) 110 mcg/actuation inhaler
110 | Freq: Two times a day (BID) | RESPIRATORY_TRACT | 6 refills | Status: AC
Start: 2021-05-19 — End: ?

## 2021-05-19 MED ORDER — lisinopriL-hydrochlorothiazide 20-12.5 mg tablet
20-12.5 | ORAL_TABLET | ORAL | 3 refills | Status: AC
Start: 2021-05-19 — End: ?

## 2021-05-19 NOTE — Progress Notes (Signed)
Chief Complaint:         Subjective:     Jordan [name] is a 61 y.o. male here today for routine 6 month f/u.   Echo in 10/2020 stable w/ mild LVH,  Normal function. Mild dilated aortic root. No change compared to 2018.   Depression stable, but always worse in the Winter.   BP has been excellent.   He changed his diet and would like to repeat lipids.      Review of Systems    Negative except hpi        Allergies: Boostrix tdap [diphth,pertus(acell),tetanus] and Duloxetine      Past Medical History:   Diagnosis Date   . Allergic rhinitis    . Anxiety and depression     [facility] [place] NP, [name] Therapist   . Elevated PSA 2014    Dr. [name]   . History of Helicobacter pylori infection    . Hypertension    . Insomnia due to anxiety and fear                Social History     Socioeconomic History   . Marital status: Married     Spouse name: None   . Number of children: 3   . Years of education: None   . Highest education level: None   Occupational History   . Occupation: Financial trader- [employer]   Tobacco Use   . Smoking status: Never   . Smokeless tobacco: Never   Substance and Sexual Activity   . Alcohol use: Yes     Comment: 2-4 times a month   . Drug use: Never   . Sexual activity: Yes     Partners: Female   Other Topics Concern   . None   Social History Narrative    Exercise- Active job, walking, stretching        Caffeine-None, due to high blood pressure      Social Determinants of Health     Financial Resource Strain: Not on file   Food Insecurity: Not on file   Transportation Needs: Not on file   Physical Activity: Not on file   Stress: Not on file   Social Connections: Not on file   Intimate Partner Violence: Not on file   Housing Stability: Not on file       Objective:      Most recent pertinent labs:  Lab Results   Component Value Date    CHOL 219 (H) 11/11/2020    TRIG 76 11/11/2020    HDL 57 11/11/2020    ALT 46 11/11/2020    AST 29 11/11/2020    ALKPHOS 51 11/11/2020     NA 135 11/11/2020    K 4.2 11/11/2020    CL 99 11/11/2020    CREATININE 1.05 11/11/2020    BUN 16 11/11/2020    CO2 30 11/11/2020    PSA 1.98 11/11/2020    GLUCOSE 78 11/11/2020       Vitals:    05/19/21 1532   BP: 134/80   Pulse: 68   SpO2: 98%   Weight: 82.6 kg   Height: 1.626 m   Body mass index is 31.24 kg/m.    Physical Exam:  General Appearance: Alert, cooperative, no distress,   Head:    Normocephalic  Lungs:  Clear to auscultation bilaterally, respirations unlabored  Heart: Regular rate and rhythm, S1 and S2 normal, no murmur, rub or gallop  ABD:   Nl BS,   soft, not tender, no masses, no hepatosplenomegaly  Extremities: Extremities normal, atraumatic, no cyanosis or edema  Pulses: 2+ and symmetric all extremities  Skin: Skin color, texture, turgor normal, no rashes or lesions  Neurologic: Nonfocal       Assessment/Plan:     Diagnoses and all orders for this visit:    Hypertensive heart disease without heart failure  -     Basic metabolic panel; Future  -     Urinalysis reflex to culture; Future  -     lisinopriL-hydrochlorothiazide 20-12.5 mg tablet; Take 2 tablets by mouth 1 (one) time each day at the same time.  BP stable and at goal.  Continue medications.   Discussed low sodium diet and regular exercise.  Pt. instructed to call office if home BP regularly > 140/90.    Anxiety and depression  -  Doing well. Cont meds and care w psych.   Mixed hyperlipidemia  -     Lipid panel; Future  Discussed low saturated fat diet and the importance of regular aerobic exercise as tolerated.    He has made some changes to his diet. Repeat labs.     Mild persistent asthma without complication  -     fluticasone (Flovent) 110 mcg/actuation inhaler; Inhale 1 puff in the morning and at bedtime. Rinse mouth with water after use to reduce aftertaste and incidence of candidiasis. Do not swallow.    He is doing well w/ his Flovent in the Spring. Refilled per request.             *Follow-up:   CPE as planned, sooner prn.      Consuelo Pandy, PA

## 2021-05-30 ENCOUNTER — Other Ambulatory Visit

## 2021-05-30 ENCOUNTER — Other Ambulatory Visit: Admit: 2021-05-30 | Payer: BLUE CROSS/BLUE SHIELD | Primary: Internal Medicine

## 2021-05-30 DIAGNOSIS — E782 Mixed hyperlipidemia: Secondary | ICD-10-CM

## 2021-05-30 LAB — URINALYSIS REFLEX TO CULTURE
Bilirubin, Ur: NEGATIVE
Blood, Ur: NEGATIVE
Glucose,Ur: NEGATIVE mg/dL
Ketones, Ur: NEGATIVE mg/dL
Leukocyte Esterase, Ur: NEGATIVE WBC/uL
Nitrite, Ur: NEGATIVE
Protein,Ur: NEGATIVE mg/dL
Specific Gravity, Ur: 1.015 (ref 1.005–1.030)
Urobilinogen, Ur: 0.2 U/dL (ref 0.2–1.0)
pH, Ur: 7 (ref 5.0–8.0)

## 2021-05-30 LAB — BASIC METABOLIC PANEL
Anion Gap: 5 mmol/L (ref 3–14)
BUN: 14 mg/dL (ref 6–24)
CO2 (Bicarbonate): 30 mmol/L (ref 20–32)
Calcium: 9.3 mg/dL (ref 8.5–10.5)
Chloride: 98 mmol/L (ref 98–110)
Creatinine: 1.11 mg/dL (ref 0.55–1.30)
Glucose: 112 mg/dL — ABNORMAL HIGH (ref 70–99)
Potassium: 4.2 mmol/L (ref 3.6–5.2)
Sodium: 133 mmol/L — ABNORMAL LOW (ref 135–146)
eGFRcr: 76 mL/min/{1.73_m2} (ref >=60)

## 2021-05-30 LAB — LIPID PANEL
Cholesterol: 170 mg/dL (ref <=200)
HDL cholesterol: 56 mg/dL (ref >=40)
LDL cholesterol, calculated: 102 mg/dL (ref 0–130)
Triglycerides: 60 mg/dL (ref <=150)

## 2021-06-19 LAB — HX STOOL CARDS

## 2021-11-23 ENCOUNTER — Inpatient Hospital Stay: Admit: 2021-11-23 | Payer: BLUE CROSS/BLUE SHIELD | Primary: Internal Medicine

## 2021-11-24 ENCOUNTER — Encounter (INDEPENDENT_AMBULATORY_CARE_PROVIDER_SITE_OTHER): Admitting: Internal Medicine

## 2022-04-19 ENCOUNTER — Inpatient Hospital Stay
Admit: 2022-04-19 | Disposition: A | Discharge status: Home / Self Care | Payer: BLUE CROSS/BLUE SHIELD | Attending: Emergency Medicine | Primary: Internal Medicine

## 2022-04-19 DIAGNOSIS — J020 Streptococcal pharyngitis: Principal | ICD-10-CM

## 2022-04-19 LAB — POCT SARS-COV-2/FLU A&B
POCT Flu A: NOT DETECTED
POCT Flu B: NOT DETECTED
POCT SARS-CoV-2: NOT DETECTED

## 2022-04-19 LAB — POCT STREP A PCR: POCT Strep A PCR: DETECTED — AB

## 2022-04-19 MED ORDER — amoxicillin (Amoxil) 875 mg tablet
875 | ORAL_TABLET | Freq: Two times a day (BID) | ORAL | 0 refills | Status: AC
Start: 2022-04-19 — End: 2022-04-29

## 2022-04-19 NOTE — ED Provider Notes (Signed)
History  Chief Complaint   Patient presents with   . Sore Throat     Pt c/o sore throat, fever, headache.      Adult male who presents with 2 days of sore throat which is sharp and worse with swallowing, associated fever and frontal headache.  Patient reports 2 of his grandchildren recently diagnosed with strep.  He denies any chills, worst headache of life, sinus symptoms, drooling or difficulty with secretions, neck stiffness, cough, shortness of breath, chest pain or pressure, nausea or vomiting, rash.  ROS negative except as above.         History provided by:  Patient  Language interpreter used: No        Past Medical History:   Diagnosis Date   . Allergic rhinitis    . Anxiety and depression     [facility] [place] NP, [name] Therapist   . Elevated PSA 2014    Dr. Patrice Paradise   . History of Helicobacter pylori infection    . Hypertension    . Insomnia due to anxiety and fear    . Sleep apnea 05/19/2021   . Spinal stenosis 11/23/2021       Past Surgical History:   Procedure Laterality Date   . PROSTATE BIOPSY  2014    Negative       Family History   Problem Relation Name Age of Onset   . Hypertension Mother          12 yrs old   . Hypertension Father     . Kidney failure Father          Dyalsis   . Heart failure Father     . Other (spine issues) Sister     . No Known Problems Daughter     . No Known Problems Daughter     . No Known Problems Son     . Other (intesinal cancer) Maternal Grandmother          38 yrs old   . Heart failure Maternal Grandfather     . No Known Problems Paternal Grandmother          deceased at 45 yrs old   . No Known Problems Paternal Grandfather         Social History     Tobacco Use   . Smoking status: Never   . Smokeless tobacco: Never   Substance Use Topics   . Alcohol use: Yes     Comment: 2-4 times a month   . Drug use: Never       Review of Systems    Physical Exam  Vitals:    04/19/22 1638   BP: (!) 153/89   BP Location: Right arm   Patient Position:  Sitting   Pulse: 80   Resp: 18   Temp: 37.7 C (99.8 F)   TempSrc: Oral   SpO2: 97%   Weight: 79.4 kg   Height: 1.676 m       Physical Exam  Vitals and nursing note reviewed.   Constitutional:       Appearance: Normal appearance.      Comments: Very pleasant and cooperative     HENT:      Head: Normocephalic and atraumatic.      Right Ear: External ear normal.      Left Ear: External ear normal.      Nose: Nose normal.      Mouth/Throat:      Mouth:  Mucous membranes are moist.      Pharynx: Posterior oropharyngeal erythema present. No oropharyngeal exudate.      Comments: Uvula midline, erythema and edema of tonsils.  Eyes:      Conjunctiva/sclera: Conjunctivae normal.   Pulmonary:      Effort: Pulmonary effort is normal. No respiratory distress.   Musculoskeletal:         General: Normal range of motion.      Cervical back: Normal range of motion and neck supple.   Lymphadenopathy:      Cervical: Cervical adenopathy present.   Skin:     General: Skin is warm and dry.   Neurological:      General: No focal deficit present.      Mental Status: He is alert and oriented to person, place, and time.   Psychiatric:         Mood and Affect: Mood normal.         Behavior: Behavior normal.         Judgment: Judgment normal.              No orders to display     Labs Reviewed   POCT STREP A PCR - Abnormal       Result Value    POCT Strep A PCR Detected (*)    POCT SARS-COV-2/FLU A&B - Normal    POCT SARS-CoV-2 Not Detected      POCT Flu A Not Detected      POCT Flu B Not Detected      Narrative:     This test was performed with the [facility] SARS-COV-2 & Influenza A/B RNA by RT PCR molecular test. Not detected results do not rule out infection with COVID-19, Influenza A and/or Influenza B. This test is offered under Emergency use Authorization by the FDA.       Procedures  Procedures    UC Course  Diagnoses as of 04/19/22 1724   Strep pharyngitis       Medical Decision Making  Adult male with above-noted symptoms.  He is  currently afebrile and satting well.  Patient tested positive for strep, negative for COVID and influenza.  Plan: Home care to include rest, hydration, nutrition, OTC medications for discomfort and fever, amoxicillin.  Side effects and usage discussed with patient, follow-up with PCP as needed.  He voiced understanding.    Strep pharyngitis: complicated acute illness or injury  Amount and/or Complexity of Data Reviewed  Labs: ordered.      Risk  Prescription drug management.          Discharge Meds  ED Prescriptions     Medication Sig Dispense Start Date End Date Auth. Provider    amoxicillin (Amoxil) 875 mg tablet Take 1 tablet (875 mg) by mouth twice daily for 10 days. 20 tablet 04/19/2022 04/29/2022 Marinell Blight, MD          Home Meds  Prior to Admission medications    Medication Sig Start Date End Date Taking? Authorizing Provider   clonazePAM (KlonoPIN) 0.5 mg tablet Take 1 mg by mouth in the morning.    Historical Provider, MD   FLUoxetine (PROzac) 20 mg capsule Take 1 capsule by mouth 1 (one) time each day at the same time.    Historical Provider, MD   fluticasone (Flovent) 110 mcg/actuation inhaler Inhale 1 puff in the morning and at bedtime. Rinse mouth with water after use to reduce aftertaste and incidence of candidiasis. Do not swallow.  05/19/21   Tarina Mansur, PA   lisinopriL-hydrochlorothiazide 20-12.5 mg tablet Take 2 tablets by mouth 1 (one) time each day at the same time. 05/19/21   Lilla Shook Mansur, PA         Total amount of time spent on day of service doing chart review, history and physical exam, order/ review results of testing ordered (if any), patient counseling, documentation: 20 minutes.      Patient encounter note may have been created using voice recognition software and in real time during the office visit. Please excuse any typographical errors that may not have been edited out.            Carlisle Beers, MD  04/19/22 [phone]

## 2022-04-19 NOTE — Other (Signed)
Patient Education  Table of Contents   Strep Throat, Adult    To view videos and all your education online visit,  https://pe.elsevier.com/[email or web address]  or scan this QR code with your smartphone.  Access to this content will expire in one year.  Strep Throat, Adult  Strep throat is an infection of the throat. It is caused by germs (bacteria). Strep throat is common during the cold months of the year. It mostly affects children who are 38?63 years old. However, people of all ages can get it at any time of the year. This infection spreads from person to person through coughing, sneezing, or having close contact.  What are the causes?  This condition is caused by the Streptococcus pyogenes germ.  What increases the risk?   You care for young children. Children are more likely to get strep throat and may spread it to others.   You go to crowded places. Germs can spread easily in such places.   You kiss or touch someone who has strep throat.  What are the signs or symptoms?   Fever or chills.    Redness, swelling, or pain in the tonsils or throat.    Pain or trouble when swallowing.   White or yellow spots on the tonsils or throat.    Tender glands in the neck and under the jaw.    Bad breath.    Red rash all over the body. This is rare.  How is this treated?   Medicines that kill germs (antibiotics).   Medicines that treat pain or fever. These include:  ? Ibuprofen or acetaminophen.   ? Aspirin, only for people who are over the age of 86.  ? Cough drops.  ? Throat sprays.  Follow these instructions at home:  Medicines     Take over-the-counter and prescription medicines only as told by your doctor.   Take your antibiotic medicine as told by your doctor. Do not stop taking the antibiotic even if you start to feel better.  Eating and drinking     If you have trouble swallowing, eat soft foods until your throat feels better.   Drink enough fluid to keep your pee (urine) pale yellow.   To help with pain, you may have:  ? Warm  fluids, such as soup and tea.  ? Cold fluids, such as frozen desserts or popsicles.  General instructions   Rinse your mouth (gargle) with a salt-water mixture 3?4 times a day or as needed. To make a salt-water mixture, dissolve ??1 tsp (3?6 g) of salt in 1 cup (237 mL) of warm water.   Rest as much as you can.   Stay home from work or school until you have been taking antibiotics for 24 hours.   Do not smoke or use any products that contain nicotine or tobacco. If you need help quitting, ask your doctor.   Keep all follow-up visits.  How is this prevented?     Do not share food, drinking cups, or personal items. They can cause the germs to spread.   Wash your hands well with soap and water. Make sure that all people in your house wash their hands well.   Have family members tested if they have a fever or a sore throat. They may need an antibiotic if they have strep throat.  Contact a doctor if:   You have swelling in your neck that keeps getting bigger.   You get a rash, cough, or earache.  You cough up a thick fluid that is green, yellow-brown, or bloody.   You have pain that does not get better with medicine.   Your symptoms get worse instead of getting better.   You have a fever.  Get help right away if:   You vomit.   You have a very bad headache.   Your neck hurts or feels stiff.   You have chest pain or are short of breath.   You have drooling, very bad throat pain, or changes in your voice.   Your neck is swollen, or the skin gets red and tender.   Your mouth is dry, or you are peeing less than normal.   You keep feeling more tired or have trouble waking up.   Your joints are red or painful.  These symptoms may be an emergency. Do not wait to see if the symptoms will go away. Get help right away. Call your local emergency services (911 in the [place]  Summary   Strep throat is an infection of the throat. It is caused by germs (bacteria).   This infection can spread from person to person through coughing,  sneezing, or having close contact.   Take your medicines, including antibiotics, as told by your doctor. Do not stop taking the antibiotic even if you start to feel better.   To prevent the spread of germs, wash your hands well with soap and water. Have others do the same. Do not share food, drinking cups, or personal items.   Get help right away if you have a bad headache, chest pain, shortness of breath, a stiff or painful neck, or you vomit.  This information is not intended to replace advice given to you by your health care provider. Make sure you discuss any questions you have with your health care provider.  Document Released: 2007-08-30 Document Updated: 2020-07-06 Document Reviewed: 2020-07-06  Elsevier Patient Education ? [place].

## 2022-08-14 NOTE — Telephone Encounter (Signed)
Front desk schedules.. please contact pt to schedule CPE

## 2022-08-14 NOTE — Telephone Encounter (Signed)
Last seen 05/19/21  Next appt- not scheduled  Allergies reviewed

## 2022-08-14 NOTE — Telephone Encounter (Signed)
Can we please see if pt will be following up with [place]? If so please schedule pt

## 2022-08-16 MED ORDER — lisinopriL-hydrochlorothiazide 20-12.5 mg tablet
20-12.5 | ORAL_TABLET | ORAL | 0 refills | Status: DC
Start: 2022-08-16 — End: 2022-08-28

## 2022-08-16 NOTE — Telephone Encounter (Signed)
Please contact pt. Last seen >77yr ago. Sent for 30 days. Needs apt in office prior to future refills.

## 2022-08-16 NOTE — Telephone Encounter (Signed)
Last Visit:   05/19/2021      Next Visit: none        Last Labs: 05/30/2021      Last Filled: 05/19/2021             Patient comment: Please let me know when this will be refilled.

## 2022-08-16 NOTE — Telephone Encounter (Signed)
LM for pt asking for cb to schedule appt if he is still a pt with [place]

## 2022-08-17 NOTE — Telephone Encounter (Signed)
Lm for pt asking that he cb to schedule a physical because he is overdue

## 2022-08-18 NOTE — Telephone Encounter (Signed)
Patient outreach letter mailed to pt asking he please call to schedule a physical.

## 2022-08-18 NOTE — Telephone Encounter (Signed)
Pt called made appt with Tarina on 08/28/22

## 2022-08-28 ENCOUNTER — Ambulatory Visit
Admit: 2022-08-28 | Discharge: 2022-08-28 | Payer: BLUE CROSS/BLUE SHIELD | Attending: Medical | Primary: Internal Medicine

## 2022-08-28 DIAGNOSIS — Z0000 Encounter for general adult medical examination without abnormal findings: Secondary | ICD-10-CM

## 2022-08-28 MED ORDER — lisinopriL-hydrochlorothiazide 20-12.5 mg tablet
20-12.5 | ORAL_TABLET | ORAL | 3 refills | Status: AC
Start: 2022-08-28 — End: 2023-08-28

## 2022-08-28 NOTE — Progress Notes (Signed)
Chief Complaint:  Chief Complaint   Patient presents with    Annual Exam     Refills needed           Subjective:     Jordan [name] is a 62 y.o. male who is here today for his  annual comprehensive exam.   Colo UTD due in 2027  Declined shingrix.     Following pain management for  lumbar radiculopathy.  On and off  using support belt. Still an issue.  Since he stopped working, the pain is better. Sometimes worse w/ yard work.     Mood stable. Cont w/ psych.  Seasonal affective disorder is terrible.  Grandchildren help.     Retired for a full yr.   Enjoying grandchildren.   Seasonal affective disorder.     1) Health Maintenance Summary:  Health Maintenance   Topic Date Due    HIV Screening  Never done    MMR Vaccines (1 of 1 - Standard series) Never done    Pneumococcal Vaccine: Pediatrics (0 to 5 Years) and At-Risk Patients (6 to 64 Years) (1 of 2 - PCV) Never done    DTaP/Tdap/Td Vaccines (1 - Tdap) Never done    Zoster Vaccines (1 of 2) Never done    Diabetes Screening  03/11/2021    Lipid Panel  05/31/2026    Colorectal Cancer Screening  06/24/2027    COVID-19 Vaccine  Completed    Depression Screening  Completed    Influenza Vaccine  Completed    Hepatitis C Screening  Completed    HIB Vaccines  Aged Out    Hepatitis B Vaccines  Aged Out    IPV Vaccines  Aged Out    Hepatitis A Vaccines  Aged Out    Meningococcal Vaccine  Aged Out    Rotavirus Vaccines  Aged Out    HPV Vaccines  Aged Out       2)  Immunizations:  Immunization History   Administered Date(s) Administered    Covid-19 Moderna vaccine bivalent (12y+) 03/15/2021    Covid-19 Moderna vaccine monovalent (12y+) 05/31/2019, 06/28/2019, 03/13/2020    Influenza, injectable, MDCK, preservative free, quadrivalent 02/05/2021, 02/25/2022    Influenza, injectable, quadrivalent, preservative free 02/25/2020    Influenza, seasonal, injectable 02/06/2018, 12/26/2018    Spikevax COVID-19 Vaccine 2023-2024 Formula 12+ 03/11/2022         Review Of Systems- see  hpi    Constitutional: Negative for fevers, chills, fatigue, unexpected weight changes.  HEENT: Negative for visual changes, oral lesions, sore throat, epistaxis, rhinorrhea.  Cardiovascular: Negative for chest pain, DOE, palpitations, lower extremity edema.  Respiratory: Negative for dyspnea, cough, or hemoptysis.  Gastrointestinal: Negative for nausea, vomiting, abdominal pain, change in bowel habits, blood or melena.  Genitourinary: Negative for dysuria, hematuria, frequency, genital discharge/lesions.  Breast: Negative for rash, asymmetry, lumps, pain, nipple discharge.  Musculoskeletal: Negative for muscle pain/weakness, joint pain/swelling, decreased range of motion.  Integumentary: Negative for rashes, pruritus, lesions or masses.   Neurological: Negative for headaches, dizziness, numbness, tingling.  Psychiatric: Negative for depressed mood, insomnia, decreased appetite.  Endocrine: Negative for polyuria, polydipsia, weight change, temperature intolerance, hair loss.  Hematologic/Lymphatic: Negative for easy bleeding or bruising, swelling of the extremities.      Current Outpatient Medications   Medication Sig Dispense Refill    clonazePAM (KlonoPIN) 0.5 mg tablet Take 1 mg by mouth in the morning.      FLUoxetine (PROzac) 20 mg capsule Take 1 capsule by mouth  1 (one) time each day at the same time.      fluticasone (Flovent) 110 mcg/actuation inhaler Inhale 1 puff in the morning and at bedtime. Rinse mouth with water after use to reduce aftertaste and incidence of candidiasis. Do not swallow. 12 g 6    lisinopriL-hydrochlorothiazide 20-12.5 mg tablet Take 2 tablets by mouth 1 (one) time each day at the same time. 180 tablet 3     No current facility-administered medications for this visit.       Allergies: Boostrix tdap [diphth,pertus(acell),tetanus] and Duloxetine    Past Medical History:   Diagnosis Date    Allergic rhinitis     Anxiety and depression     [facility]  NP, [facility] Therapist    Elevated PSA 2014    Dr. [name]    History of Helicobacter pylori infection     Hypertension     Insomnia due to anxiety and fear     Sleep apnea 05/19/2021    Spinal stenosis 11/23/2021       Past Surgical History:   Procedure Laterality Date    PROSTATE BIOPSY  2014    Negative       Family History   Problem Relation Name Age of Onset    Hypertension Mother Jordan [name]         41 yrs old    Hypertension Father Jordan [name]     Kidney failure Father Jordan [name]         Dyalsis    Heart failure Father Jordan [name]     Other (spine issues) Sister      No Known Problems Daughter      No Known Problems Daughter      No Known Problems Son      Other (intesinal cancer) Maternal Grandmother          34 yrs old    Heart failure Maternal Grandfather      No Known Problems Paternal Grandmother          deceased at 44 yrs old    No Known Problems Paternal Grandfather         Social History     Socioeconomic History    Marital status: Married     Spouse name: None    Number of children: 3    Years of education: None    Highest education level: None   Occupational History    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  Never smoker, Patient was counseled on the dangers of tobacco use:  11/11/2020.     Alcohol  Did a drink containing alcohol in the last year?  Yes, How often did you have a drink containing alcohol within the last year?  Two to four times a month (2 points), How many drinks did you have containing alcohol on a typical day in the last year?  1 or 2 (0 points), How often did you have  six or more drinks containing alcohol within the last year?  Never (0 points), Points  2, Interpretation  Negative.     Alcohol (TEXT ONLY): socially.    no Drug use.    Marital Status: married, 36 yrs married.    Children: sons:1 daughters:2.    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    no Exercise, active job walking for exercise, stretching w/ inversion table..    no Caffeine, due to high blood pressure.    Sexually active: Had sex in the last 12 months (vaginal,anal,oral): Yes, with: Women only, Use Protection?: No, Prevention strategies discussed: Other, Have you ever had an STD?: No.    Travel ouside [place]: none planned..                 Social Determinants of Health     Financial Resource Strain: Low Risk  (08/21/2022)    Overall Financial Resource Strain (CARDIA)     Difficulty of Paying Living Expenses: Not hard at all   Food Insecurity: Unknown (08/21/2022)    Hunger Vital Sign     Worried About Running Out of Food in the Last Year: Never true     Ran Out of Food in the Last Year: Not on file   Transportation Needs: No Transportation Needs (08/21/2022)    PRAPARE - Therapist, art (Medical): No     Lack of Transportation (Non-Medical): No   Physical Activity: Not on file   Stress: Not on file   Social Connections: Not on file   Intimate Partner Violence: Not on file   Housing Stability: Unknown (08/21/2022)    Housing Stability Vital Sign     Unable to Pay for Housing in the Last Year: Not on file     Number of Places Lived in the Last Year: Not on file     Unstable Housing in the Last Year: No       Objective:     Previous pertinent lab results:  Lab Results   Component Value Date    CHOL 170 05/30/2021    TRIG 60 05/30/2021    HDL 56 05/30/2021    ALT 46 11/11/2020    AST 29 11/11/2020    NA 133 (L) 05/30/2021    K 4.2 05/30/2021    CL 98 05/30/2021    CREATININE 1.11 05/30/2021    BUN 14 05/30/2021    CO2 30 05/30/2021    PSA 1.98 11/11/2020     GLUCOSE 112 (H) 05/30/2021       Vitals:    08/28/22 1541 08/28/22 1608   BP: (!) 142/70 124/82   Pulse: 72    SpO2: 98%    Weight: 79.4 kg    Height: 1.676 m      Body mass index is 28.26 kg/m.    Physical Exam:  General Appearance: Pleasant, no acute distress, well developed, well nourished.  Head: Normocephalic  Eyes: Conjunctiva/corneas clear, PERRLA, EOM's intact  Ears: Normal TM's and external ear canals, both ears  Nose: Nares patent  Throat: Clear  Neck: Supple, no adenopathy, no thyromegaly  Back: FROM  Lungs: Clear to auscultation bilaterally  Heart: Regular rate and rhythm, S1 and S2 normal, no murmur, rub or gallop  Breast Exam: No dimpling, no tenderness, no masses, no nipple discharge  Abdomen: Soft, non-tender, bowel sounds active  all four quadrants, no masses, no organomegaly  Genitalia: defer  Extremities: Extremities normal, atraumatic, no cyanosis or edema  Pulses: 2+ and symmetric throughout  Skin: Turgor normal, no rashes or suspicious lesions  Neurologic: CNII-XII intact, normal strength, sensation and reflexes throughout      Assessment and Plan:     Rayansh was seen today for annual exam.    Diagnoses and all orders for this visit:    Routine adult health maintenance  -     CBC and differential; Future  -     Comprehensive metabolic panel; Future  -     Lipid panel; Future  -     PSA Total, Screen; Future  -     Urinalysis reflex to culture; Future  -     Albumin, Urine, Random (Microalbumin Random, Including Creatinine); Future  -     CBC and differential  -     Comprehensive metabolic panel  -     Lipid panel  -     PSA Total, Screen  -     Urinalysis reflex to culture  - rev'd rhm, healthy lifestyle.   Hypertensive heart disease without heart failure  -     lisinopriL-hydrochlorothiazide 20-12.5 mg tablet; Take 2 tablets by mouth 1 (one) time each day at the same time.  - BP stable and at goal.  Continue medications.   Discussed low sodium diet and regular exercise.  Pt. instructed to  call office if home BP regularly > 140/90.    Mixed hyperlipidemia  - Discussed low saturated fat diet and the importance of regular aerobic exercise as tolerated.      Obstructive sleep apnea syndrome  -   avoid sleeping supine.   Avoid sedatives before bed.   Generalized anxiety disorder  - stable w/ meds. Cont w/ prescriber.   Mild persistent asthma without complication (HHS-HCC)  - no recent sxs.  Stable w/ Flovent.   Aortic ectasia, thoracic (CMS-HCC)  -     TTE Complete - Transthoracic Echo Complete; Future  - no longer sees cardio. Last echo 2 yrs ago stable. Repeat now to ensure stable. He has been feeling well. No sxs.        Other Patient Counseling Discussed:  --Nutrition/Weight Management  --Exercise: Stressed the importance of regular exercise.   --Substance Abuse: none    ---Dental / Vision  health  --Immunizations reviewed.  --Discussed benefits of screening colonoscopy , testicular exams.   Patient Health Questionnaire-2 Score: 0   Interpretation: Negative screening.     Follow-up & Interventions: Maintain annual screening - No additional Follow-up required      Follow-Up:  Follow up for 6 month BP and 1 yr CPE, MC. sooner prn. [name]        Consuelo Pandy, PA

## 2022-08-29 LAB — COMPREHENSIVE METABOLIC PANEL
A/G Ratio: 1.8 (calc) (ref 1.0–2.5)
ALT: 21 U/L (ref 9–46)
AST: 17 U/L (ref 10–35)
Albumin: 4.8 g/dL (ref 3.6–5.1)
Alkaline Phosphatase: 50 U/L (ref 35–144)
Bilirubin, total: 0.9 mg/dL (ref 0.2–1.2)
CALCIUM: 10.2 mg/dL (ref 8.6–10.3)
CARBON DIOXIDE: 31 mmol/L (ref 20–32)
CHLORIDE: 98 mmol/L (ref 98–110)
CREATININE: 1.1 mg/dL (ref 0.70–1.35)
EGFR: 75 mL/min/{1.73_m2} (ref >=60)
GLUCOSE: 93 mg/dL (ref 65–99)
Globulin, Total: 2.7 g/dL (ref 1.9–3.7)
POTASSIUM: 4.4 mmol/L (ref 3.5–5.3)
Protein, total: 7.5 g/dL (ref 6.1–8.1)
SODIUM: 137 mmol/L (ref 135–146)
UREA NITROGEN (BUN): 17 mg/dL (ref 7–25)

## 2022-08-29 LAB — URINALYSIS REFLEX TO CULTURE
BACTERIA: NONE SEEN /HPF
BILIRUBIN: NEGATIVE
GLUCOSE: NEGATIVE
HYALINE CAST: NONE SEEN /LPF
KETONES: NEGATIVE
LEUKOCYTE ESTERASE: NEGATIVE
NITRITE: NEGATIVE
OCCULT BLOOD: NEGATIVE
PH: 5.5 (ref 5.0–8.0)
PROTEIN: NEGATIVE
RBC: NONE SEEN /HPF (ref <=2)
SPECIFIC GRAVITY: 1.02 (ref 1.001–1.035)
SQUAMOUS EPITHELIAL CELLS: NONE SEEN /HPF (ref <=5)
WBC: NONE SEEN /HPF (ref <=5)

## 2022-08-29 LAB — CBC W/DIFF
ABSOLUTE BASOPHILS: 53 {cells}/uL (ref 0–200)
ABSOLUTE EOSINOPHILS: 211 {cells}/uL (ref 15–500)
ABSOLUTE LYMPHOCYTES: 2006 {cells}/uL (ref 850–3900)
ABSOLUTE MONOCYTES: 730 {cells}/uL (ref 200–950)
ABSOLUTE NEUTROPHILS: 5799 {cells}/uL (ref 1500–7800)
BASOPHILS: 0.6 %
EOSINOPHILS: 2.4 %
HEMATOCRIT: 46.3 % (ref 38.5–50.0)
HEMOGLOBIN: 15.8 g/dL (ref 13.2–17.1)
LYMPHOCYTES: 22.8 %
MCH: 28.9 pg (ref 27.0–33.0)
MCHC: 34.1 g/dL (ref 32.0–36.0)
MCV: 84.8 fL (ref 80.0–100.0)
MONOCYTES: 8.3 %
MPV: 10.4 fL (ref 7.5–12.5)
NEUTROPHILS: 65.9 %
Platelets: 271 10*3/uL (ref 140–400)
RDW: 13.1 % (ref 11.0–15.0)
RED BLOOD CELL COUNT: 5.46 10*6/uL (ref 4.20–5.80)
WHITE BLOOD CELL COUNT: 8.8 10*3/uL (ref 3.8–10.8)

## 2022-08-29 LAB — LIPID PANEL
CHOL/HDLC RATIO: 3.6 (calc) (ref <5.0)
CHOLESTEROL, TOTAL: 208 mg/dL — ABNORMAL HIGH (ref <200)
EXT NON HDL CHOLESTEROL: 151 mg/dL — ABNORMAL HIGH (ref <130)
HDL CHOLESTEROL: 57 mg/dL (ref >=40)
LDL-CHOLESTEROL: 133 mg/dL — ABNORMAL HIGH
TRIGLYCERIDES: 79 mg/dL (ref <150)

## 2022-08-29 LAB — REFLEXIVE URINE CULTURE

## 2022-08-29 LAB — PSA TOTAL, SCREEN: PSA, TOTAL: 3.34 ng/mL (ref <=4.00)

## 2022-08-29 NOTE — Patient Instructions (Signed)
Repeat PSA next visit.

## 2022-09-04 NOTE — Telephone Encounter (Signed)
Rec'd notice pt did not review his results.   Pls ensure he is aware of below . Thanks.       Hi Orest, the labs showed mild high cholesterol. Pls continue to work on low fat diet, regular exercise.   ( Less ice cream - haha).     We will repeat it again in 1 yr.   The rest of the results are normal.  The PSA for prostate is normal, bu higher than last yr. I see from previous yrs it has been in the 3 range.  I'd like to repeat next time you're in.  Thank you, Rosendo Couser

## 2022-09-05 ENCOUNTER — Encounter: Payer: BLUE CROSS/BLUE SHIELD | Attending: Medical | Primary: Internal Medicine

## 2022-09-05 NOTE — Telephone Encounter (Signed)
Per EPIC, patient did see message on 09/04/2022.  LM on patient's VM to call with any additional questions or concerns.

## 2022-09-27 ENCOUNTER — Inpatient Hospital Stay: Admit: 2022-09-27 | Payer: BLUE CROSS/BLUE SHIELD | Primary: Internal Medicine

## 2022-09-27 DIAGNOSIS — I77810 Thoracic aortic ectasia: Secondary | ICD-10-CM

## 2022-12-22 IMAGING — MR PROSTATA
15 series · 33 of 48 positions shown · IV contrast (0 10)
Comparison: none

[Series 3: T2 · sagittal · 3.5mm · 0.39mm/px · 1 of 20 slices shown (1 of 3)]
[im 1/20]
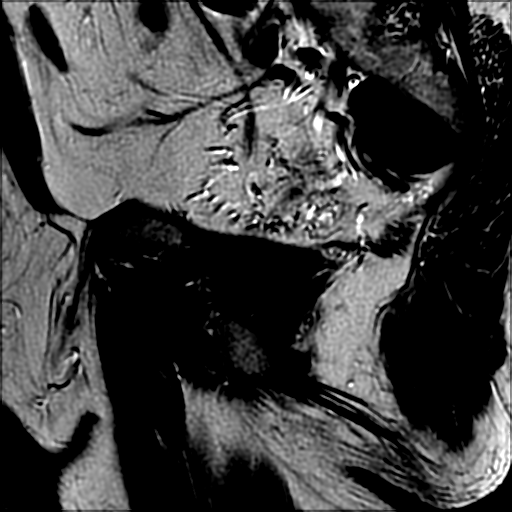

[Series 4: T2 · axial · 3.0mm · 0.35mm/px · 1 of 28 slices shown (2 of 3)]
[im 1/28]
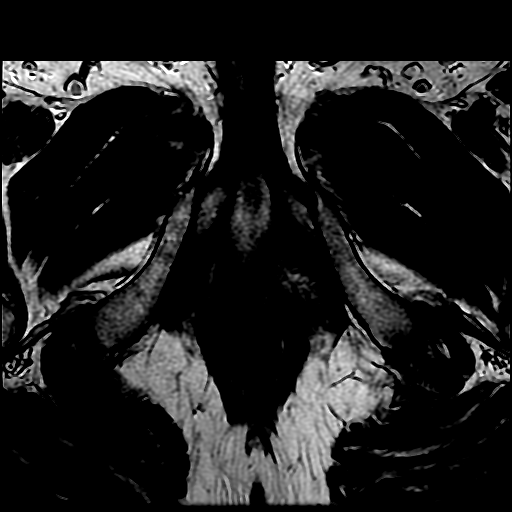

[Series 5: ax dw focus · axial · 4.0mm · 0.78mm/px · 1 of 42 slices shown (1 of 2)]
[im 1/42]
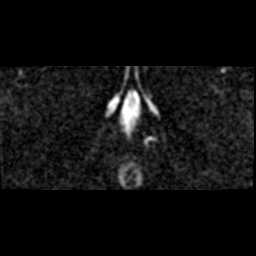

[Series 6: ax dw focus · axial · 4.0mm · 0.78mm/px · 1 of 21 slices shown (2 of 2)]
[im 1/21]
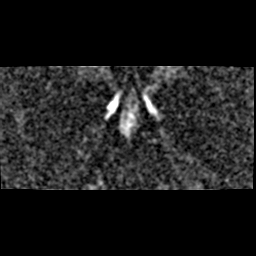

[Series 7: T2 · coronal · 3.5mm · 0.39mm/px · 1 of 20 slices shown (3 of 3)]
[im 1/20]
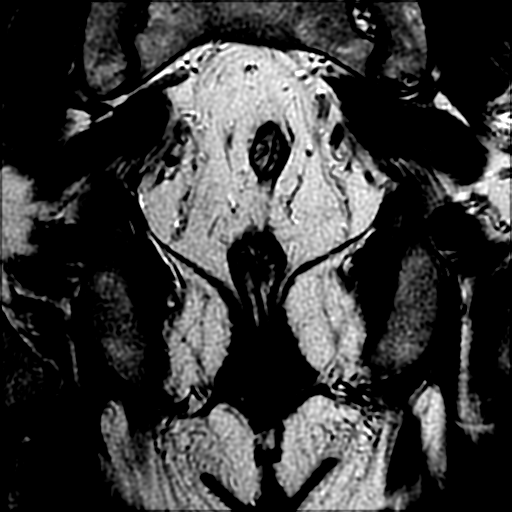

[Series 8: T1 dynamic · axial · non-contrast · 4.0mm · 0.74mm/px · 1 of 128 slices shown (1 of 6)]
[im 1/128]
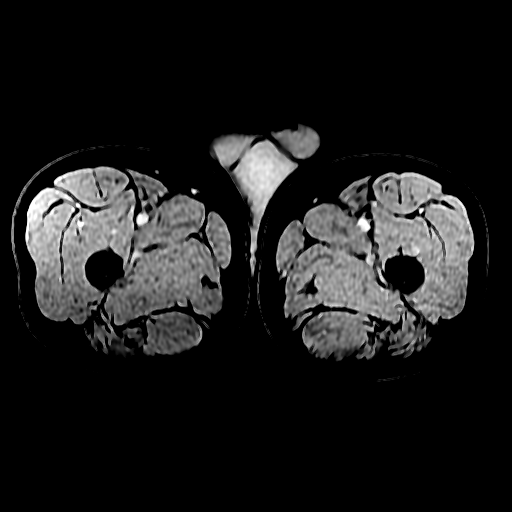

[Series 9: (person_name): ax perfu · axial · 3.0mm · 0.86mm/px · z∈[+121,+201]mm · 8 of 924 slices shown]
[im 1/924]
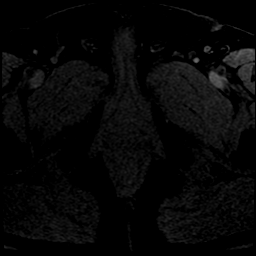
[im 132/924]
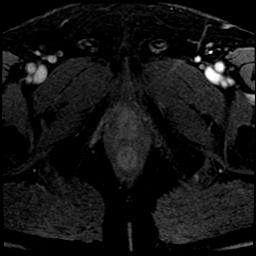
[im 264/924]
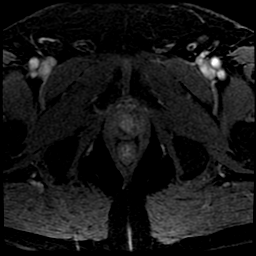
[im 396/924]
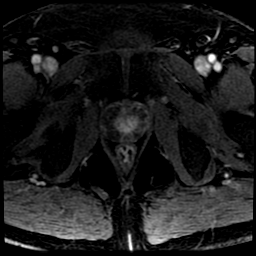
[im 528/924]
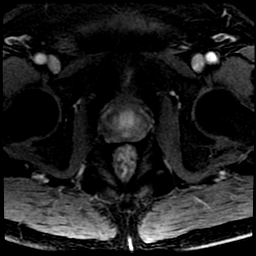
[im 660/924]
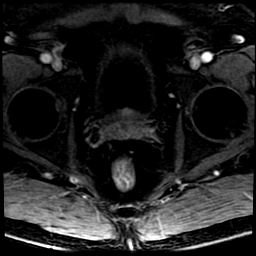
[im 792/924]
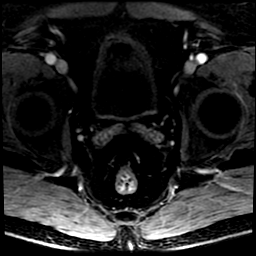
[im 924/924]
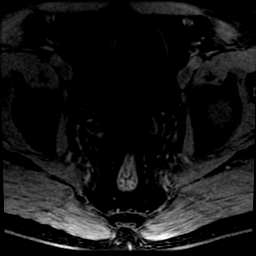

[Series 10: T1 dynamic · axial · 4.0mm · 0.74mm/px · 1 of 128 slices shown (2 of 6)]
[im 1/128]
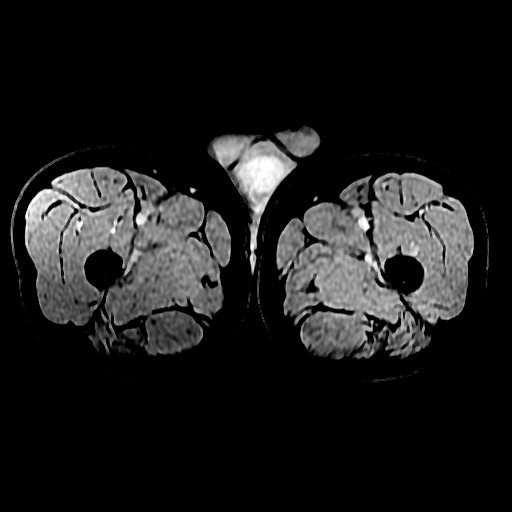

[Series 550: ADC · axial · 4.0mm · 0.78mm/px · 1 of 21 slices shown]
[im 1/21]
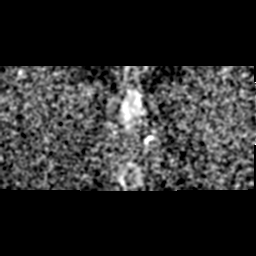

[Series 551: eadc · axial · 4.0mm · 0.78mm/px · 1 of 21 slices shown]
[im 1/21]
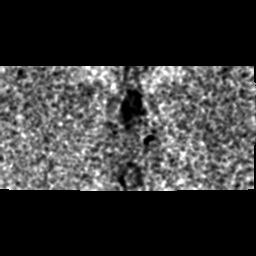

[Series 800: T1 dynamic · axial · non-contrast · 4.0mm · 0.74mm/px · z∈[+0,+254]mm · 2 of 128 slices shown (3 of 6)]
[im 1/128]
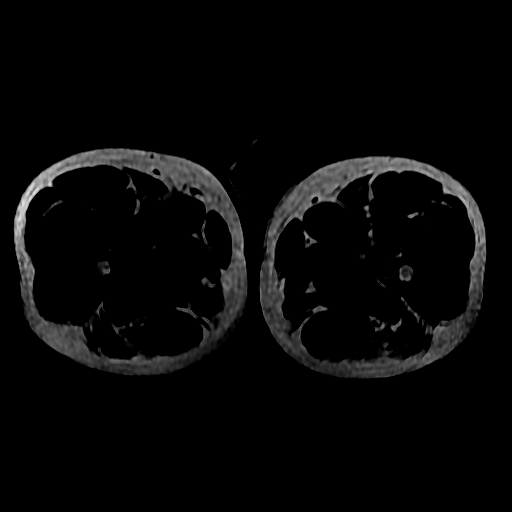
[im 128/128]
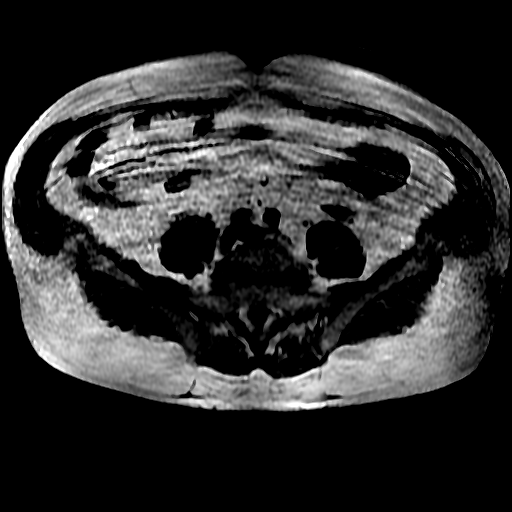

[Series 801: T1 dynamic · axial · non-contrast · 4.0mm · 0.74mm/px · z∈[+0,+254]mm · 2 of 128 slices shown (4 of 6)]
[im 1/128]
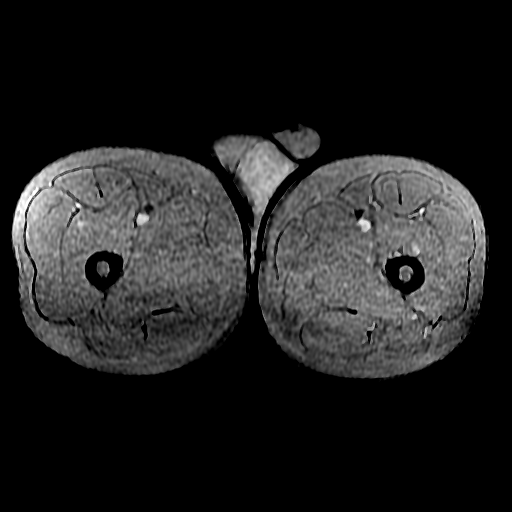
[im 128/128]
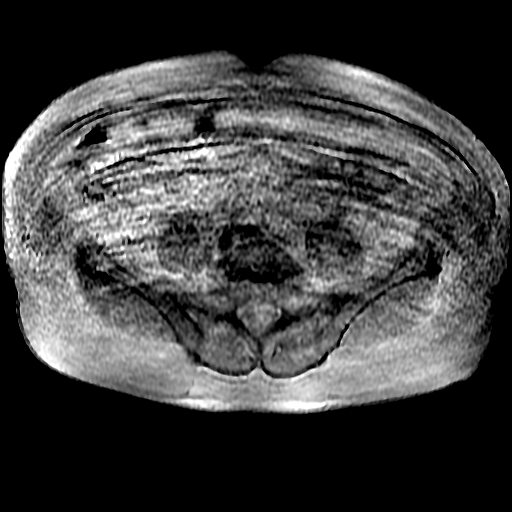

[Series 802: T1 dynamic · axial · non-contrast · 4.0mm · 0.74mm/px · z∈[+0,+254]mm · 2 of 128 slices shown (5 of 6)]
[im 1/128]
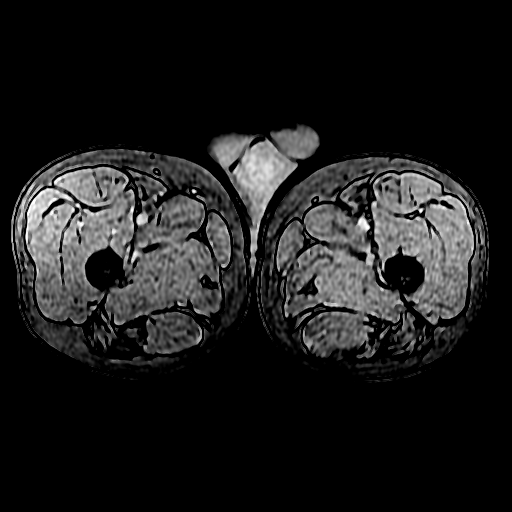
[im 128/128]
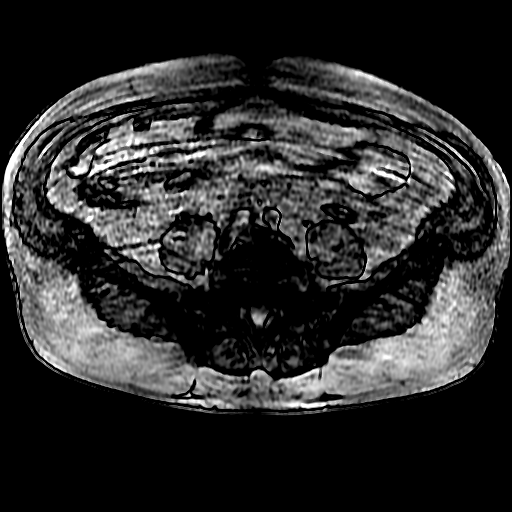

[Series 900: fat: ax perfu · axial · 3.0mm · 0.86mm/px · z∈[+124,+201]mm · 8 of 924 slices shown]
[im 62/924]
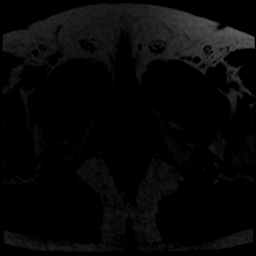
[im 185/924]
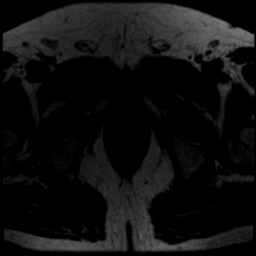
[im 308/924]
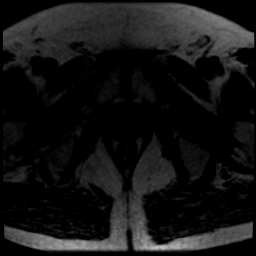
[im 431/924]
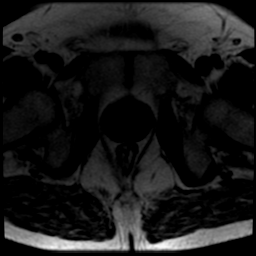
[im 554/924]
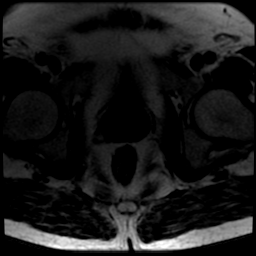
[im 677/924]
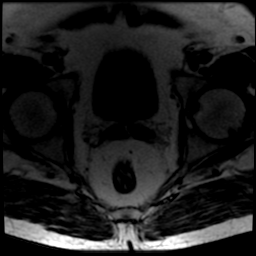
[im 800/924]
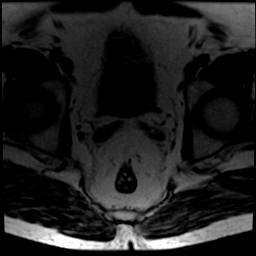
[im 924/924]
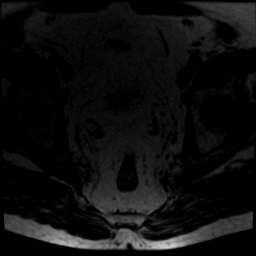

[Series 1000: T1 dynamic · axial · 4.0mm · 0.74mm/px · z∈[+0,+254]mm · 2 of 128 slices shown (6 of 6)]
[im 1/128]
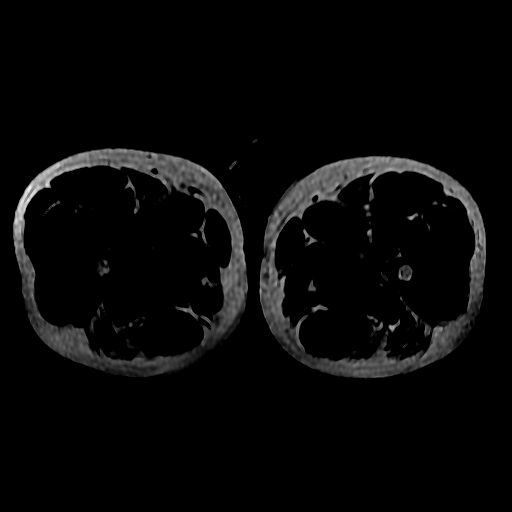
[im 128/128]
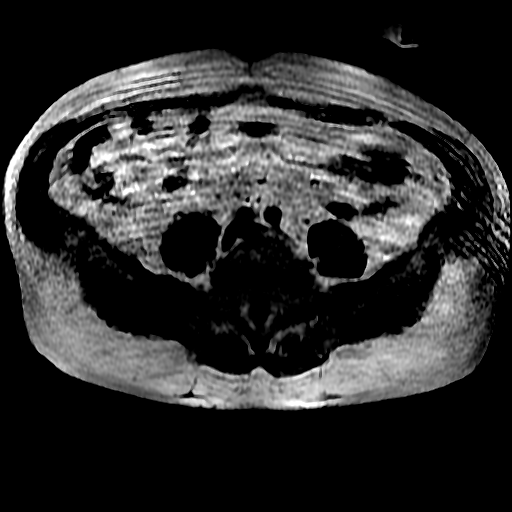

[33 of 48 positions shown; findings below may reference images not displayed]

RESSONÂNCIA MAGNÉTICA DA PRÓSTATA

TÉCNICA:
Exame realizado em equipamento de ressonância magnética com sequências, ponderações e planos específicos para o segmento de interesse, antes e após o uso do meio de contraste.

RESULTADO:
Dimensões da próstata: 4,7 x 4,4 x 3,3 cm. Peso estimado em 35,5 gramas.
Não há protrusão intravesical do lobo mediano.
Zona de transição: não há focos suspeitos para neoplasia significante.
Zona periférica: heterogênea devido faixas e estrias com baixo sinal em T2, frequentemente relacionada a afecção inflamatória crônica. Destaca-se foco nodular de baixo sinal em T2, com marcada restrição à difusão, apical anterior direita, indissociável do estroma fibromuscular, medindo 1,1 cm.
Cápsula prostática:sem abaulamentos ou irregularidades. Feixes vasculonervosos livres.
Vesículas seminais:simétricas, com morfologia e sinal normais.
Linfonodos:ausência de linfonodomegalias.
Pelve óssea: ausência de lesões com características suspeitas para natureza secundária.
Bexiga: paredes finas e conteúdo líquido homogêneo.
Achados adicionais: nada digno de nota.

CONCLUSÃO:
Heterogeneidade da zona periférica frequentemente relacionada a afecção inflamatória crônica.
Nódulo periférico direito suspeito. Caso uma biopsia seja indicada, recomenda-se a obtenção de fragmentos adicionais direcionados.
PIRADS score [DATE] - Alta probabilidade de neoplasia prostática clinicamente significante.
Nota: A ressonância magnética da próstata tem acurácia limitada na detecção de lesões neoplásicas de pequeno volume (0,5 cm³) ou de baixa agressividade (Gleason ≤ 6).

## 2023-02-27 ENCOUNTER — Encounter: Payer: BLUE CROSS/BLUE SHIELD | Attending: Medical | Primary: Internal Medicine

## 2023-03-14 ENCOUNTER — Ambulatory Visit
Admit: 2023-03-14 | Discharge: 2023-03-14 | Payer: BLUE CROSS/BLUE SHIELD | Attending: Medical | Primary: Internal Medicine

## 2023-03-14 DIAGNOSIS — R972 Elevated prostate specific antigen [PSA]: Secondary | ICD-10-CM

## 2023-03-14 NOTE — Progress Notes (Signed)
Chief Complaint:  Chief Complaint   Patient presents with    Follow-up     6 mo F/U          Subjective:     Jordan [name] is a 62 y.o. male here today for   30month f/u.     Taking meds.    Mood stable but.  Struggles in the winter.  Election stress.   Meets w/ psychiatrist.   Fully retired.    Restoring his truck and going on walks.   Helping to care for grandkids.   Snacking on Cashews and dark chocolate.  Macadamia nuts.  Had some back pain   threw  back out while using the lawn mower.       PSA was normal but higher than previous in June.  Due to repeat today.     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 Stable from 2022.   4.1 cm     Thre out his back in  the summer   working w/ Surveyor, mining.    Doing some home exercise. Has been in PT in the past.     C/o bloating sometimes after eating.  Read it could be his pancreas.  Denies fever, chills,n/v/d    Might go to [facility] in March 2025.      Health maintenance updates:  Health Maintenance   Topic Date Due    HIV Screening  Never done    MMR Vaccines (1 of 1 - Standard series) Never done    Diabetes Screening  Never done    DTaP/Tdap/Td Vaccines (1 - Tdap) Never done    Pneumococcal Vaccine: Pediatrics (0 to 5 Years) and At-Risk Patients (6 to 64 Years) (1 of 2 - PCV) Never done    Zoster Vaccines (1 of 2) Never done    COVID-19 Vaccine (6 - 2024-25 season) 11/26/2022    Colorectal Cancer Screening  06/24/2027    Lipid Panel  08/28/2027    Depression Screening  Completed    Influenza Vaccine  Completed    Hepatitis C Screening  Completed    HIB Vaccines  Aged Out    Hepatitis B Vaccines  Aged Out    IPV Vaccines  Aged Out    Hepatitis A Vaccines  Aged Out    Meningococcal Vaccine  Aged Out    Rotavirus Vaccines  Aged Out    HPV Vaccines  Aged Out    Meningococcal B Vaccine  Aged Out         Review of Systems    Negative except hpi    Current Outpatient Medications   Medication Sig Dispense Refill    clonazePAM (KlonoPIN) 0.5 mg tablet Take 1 mg by mouth in the  morning.      FLUoxetine (PROzac) 20 mg capsule Take 1 capsule by mouth 1 (one) time each day at the same time.      fluticasone (Flovent) 110 mcg/actuation inhaler Inhale 1 puff in the morning and at bedtime. Rinse mouth with water after use to reduce aftertaste and incidence of candidiasis. Do not swallow. 12 g 6    lisinopriL-hydrochlorothiazide 20-12.5 mg tablet Take 2 tablets by mouth 1 (one) time each day at the same time. 180 tablet 3     No current facility-administered medications for this visit.       Allergies: Boostrix tdap [diphth,pertus(acell),tetanus] and Duloxetine    Immunization History   Administered Date(s) Administered    Covid-19 Moderna vaccine  bivalent (12y+) 03/15/2021    Covid-19 Moderna vaccine monovalent (12y+) 05/31/2019, 06/28/2019, 03/13/2020    Influenza, injectable, MDCK, preservative free, quadrivalent 02/05/2021, 02/25/2022    Influenza, injectable, quadrivalent, preservative free 02/25/2020    Influenza, seasonal, injectable 02/06/2018, 12/26/2018, 02/09/2023    Spikevax COVID-19 Vaccine Formula 12+ 03/11/2022     Past Medical History:   Diagnosis Date    Allergic rhinitis     Anxiety and depression (CMS-HCC)     [facility] [place] NP, [name] Therapist    Elevated PSA 2014    Dr. 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)     Sleep apnea 05/19/2021    Spinal stenosis 11/23/2021       Past Surgical History:   Procedure Laterality Date    PROSTATE BIOPSY  2014    Negative       Family History   Problem Relation Name Age of Onset    Hypertension Mother Laquinton Toole         18 yrs old    Hypertension Father Alexandrino [name]     Kidney failure Father Alexandrino Haslett         Dyalsis    Heart failure Father Alexandrino [name]     Other (spine issues) Sister      No Known Problems Daughter      No Known Problems Daughter      No Known Problems Son      Other (intesinal cancer)  Maternal Grandmother          63 yrs old    Heart failure Maternal Grandfather      No Known Problems Paternal Grandmother          deceased at 32 yrs old    No Known Problems Paternal Grandfather         Social History     Socioeconomic History    Marital status: Married     Spouse name: None    Number of children: 3    Years of education: None    Highest education level: None   Occupational History    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  Never smoker, Patient was counseled on the dangers of tobacco use:  11/11/2020.     Alcohol  Did a drink containing alcohol in the last year?  Yes, How often did you have a drink containing alcohol within the last year?  Two to four times a month (2 points), How many drinks did you have containing alcohol on a typical day in the last year?  1 or 2 (0 points), How often did you have six or more drinks containing alcohol within the last year?  Never (0 points), Points  2, Interpretation  Negative.     Alcohol (TEXT ONLY): socially.    no Drug use.    Marital Status: married, 36 yrs married.    Children: sons:1 daughters:2.    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    no Exercise, active job walking for exercise, stretching w/ inversion table..    no Caffeine, due to high blood pressure.    Sexually active: Had sex in the last 12 months (vaginal,anal,oral): Yes, with: Women only, Use Protection?: No, Prevention strategies discussed: Other, Have you ever had an STD?: No.    Travel ouside [place]: none  planned..                 Social Determinants of Health     Financial Resource Strain: Low Risk  (08/21/2022)    Overall Financial Resource Strain (CARDIA)     Difficulty of Paying Living Expenses: Not hard at all   Food Insecurity: Unknown (08/21/2022)    Hunger Vital Sign     Worried About Running Out of Food in the Last Year: Never true     Ran Out of Food in the Last Year: Not on file   Transportation Needs: No Transportation Needs (08/21/2022)    PRAPARE - Therapist, art (Medical): No     Lack of Transportation (Non-Medical): No   Physical Activity: Not on file   Stress: Not on file   Social Connections: Not on file   Intimate Partner Violence: Not on file   Housing Stability: Unknown (08/21/2022)    Housing Stability Vital Sign     Unable to Pay for Housing in the Last Year: Not on file     Number of Places Lived in the Last Year: Not on file     Unstable Housing in the Last Year: No       Objective:      Most recent pertinent labs:  Lab Results   Component Value Date    WBC 8.8 08/28/2022    HGB 15.8 08/28/2022    PLT 271 08/28/2022    CHOL 170 05/30/2021    TRIG 79 08/28/2022    HDL 57 08/28/2022    LDL 133 (H) 08/28/2022    ALT 21 08/28/2022    AST 17 08/28/2022    ALKPHOS 50 08/28/2022    NA 137 08/28/2022    K 4.4 08/28/2022    CL 98 05/30/2021    CREATININE 1.10 08/28/2022    BUN 17 08/28/2022    CO2 31 08/28/2022    PSA 1.98 11/11/2020    GLUCOSE 112 (H) 05/30/2021       Vitals:    03/14/23 1027 03/14/23 1046   BP: (!) 140/90 134/80   BP Location: Right arm    Patient Position: Sitting    BP Cuff Size: Large adult    Pulse: 61    Temp: 36.4 C (97.6 F)    TempSrc: Oral    SpO2: 95%    Weight: 82.6 kg    Height: 1.676 m    Body mass index is 29.38 kg/m.    Physical Exam:  General Appearance: Alert, cooperative, no distress   HEENT: Normocephalic,  Eyes-anicteric.     Pharynx pink and moist  Neck:   Supple,  Normal ROM.   No thyromegaly, No bruits, no LAD  Lungs:  Clear to  auscultation bilaterally, respirations unlabored  Heart: Regular rate and  rhythm, S1 and S2 normal, no murmur, rub or gallop  ABD:   Nl BS,   soft, not tender, no masses, no hepatosplenomegaly  Extremities: Extremities normal, atraumatic, no cyanosis or edema  Pulses: 2+ and symmetric all extremities  Skin: Skin color, texture, turgor normal, no rashes or lesions  Neurologic: Nonfocal       Assessment/Plan:     Jordan [name] was seen today for follow-up.    Diagnoses and all orders for this visit:    Elevated PSA  -     PSA Total, Screen; Future  -     PSA Total, Screen  - check lab  to ensure stable.   Hypertensive heart disease without heart failure (CMS-HCC)  -     Cancel: Basic metabolic panel; Future  -     Albumin, Urine, Random (Microalbumin Random, Including Creatinine)  -     Cancel: Basic metabolic panel  - BP stable and at goal.  Continue medications.   Discussed low sodium diet and regular exercise.  Pt. instructed to call office if home BP regularly > 140/90.  Stable BP at home  120/s 70/s.     Cont current meds.   Mild persistent asthma without complication (Multi-HCC)  - breathing stable.   Current mild episode of major depressive disorder, unspecified whether recurrent (CMS-HCC)  - cont meds and care w/ psychiatry.   Denies SI or HI.   Bloating  -     Comprehensive metabolic panel; Future  -     Lipase; Future  -     Comprehensive metabolic panel  -     Lipase  - check labs as noted. Keep food log to see if he can identify trigger.          *Follow-up: Follow up for as planned, sooner prn. [name]    Consuelo Pandy, PA

## 2023-03-15 LAB — COMPREHENSIVE METABOLIC PANEL
A/G Ratio: 2 (calc) (ref 1.0–2.5)
ALT: 27 U/L (ref 9–46)
AST: 19 U/L (ref 10–35)
Albumin: 4.9 g/dL (ref 3.6–5.1)
Alkaline Phosphatase: 48 U/L (ref 35–144)
Bilirubin, total: 0.7 mg/dL (ref 0.2–1.2)
CALCIUM: 10.2 mg/dL (ref 8.6–10.3)
CARBON DIOXIDE: 31 mmol/L (ref 20–32)
CHLORIDE: 95 mmol/L — ABNORMAL LOW (ref 98–110)
CREATININE: 0.99 mg/dL (ref 0.70–1.35)
EGFR: 86 mL/min/{1.73_m2} (ref >=60)
GLUCOSE: 107 mg/dL — ABNORMAL HIGH (ref 65–99)
Globulin, Total: 2.4 g/dL (ref 1.9–3.7)
POTASSIUM: 4.6 mmol/L (ref 3.5–5.3)
Protein, total: 7.3 g/dL (ref 6.1–8.1)
SODIUM: 135 mmol/L (ref 135–146)
UREA NITROGEN (BUN): 19 mg/dL (ref 7–25)

## 2023-03-15 LAB — PSA TOTAL, SCREEN: PSA, TOTAL: 2.65 ng/mL (ref <=4.00)

## 2023-03-15 LAB — ALBUMIN, URINE, RANDOM (INCLUDING CREATININE)
ALBUMIN, URINE: 1 mg/dL
ALBUMIN/CREATININE RATIO, RANDOM URINE: 9 mg/g{creat} (ref <30)
CREATININE, RANDOM URINE: 113 mg/dL (ref 20–320)

## 2023-03-15 LAB — LIPASE: LIPASE: 30 U/L (ref 7–60)

## 2023-08-27 ENCOUNTER — Ambulatory Visit: Admit: 2023-08-27 | Discharge: 2023-08-27 | Payer: BLUE CROSS/BLUE SHIELD | Primary: Internal Medicine

## 2023-08-27 DIAGNOSIS — K644 Residual hemorrhoidal skin tags: Secondary | ICD-10-CM

## 2023-08-27 MED ORDER — hydrocortisone (Anusol-HC) 2.5 % rectal cream
2.5 | Freq: Four times a day (QID) | TOPICAL | 0 refills | Status: AC | PRN
Start: 2023-08-27 — End: 2024-08-26

## 2023-08-27 NOTE — Progress Notes (Cosign Needed)
 [facility]  [facility]  [address]  [address]  [place] [address]  Dept: [phone]  Dept Fax: [phone]     Patient ID: Jordan [name] is a 63 y.o. male with medical history as below, presents today for Hemorrhoids.    Subjective   HPI  63 year old male here today with hemorrhoids.  Painful x 2 weeks. Started after he went for a long bike ride. Hurts to sit  Denies diarrhea constipation hematochezia.  Colonoscopy 2022 per pt  He tried preparation H, recti care, bacitracin, benzocaine, witch hazel pads without much relief.       Problem List[1]    Current Outpatient Medications   Medication Instructions    clonazePAM (KLONOPIN) 1 mg, Daily    FLUoxetine (PROzac) 20 mg capsule 1 capsule, Every 24 hours    fluticasone (Flovent) 110 mcg/actuation inhaler 1 puff, inhalation, 2 times daily RT, Rinse mouth with water after use to reduce aftertaste and incidence of candidiasis. Do not swallow.    hydrocortisone (Anusol-HC) 2.5 % rectal cream rectal, 4 times daily PRN, Apply to affected areas    lisinopriL-hydrochlorothiazide 20-12.5 mg tablet 2 tablets, oral, Every 24 hours     Allergies[2]  Medical History[3]  Surgical History[4]  Family History[5]  Social History[6]    Objective   Visit Vitals  BP (!) 140/95 (BP Location: Left arm, Patient Position: Sitting, BP Cuff Size: Adult)   Pulse 64   Ht 1.676 m   Wt 81.7 kg   SpO2 97%   BMI 29.10 kg/m   BSA 1.95 m       ROS   All other systems were reviewed and were negative except in HPI.    Physical Exam   Generally: NAD, alert and oriented   Lungs: clear to auscultation bilaterally, no wheeze/rhonchi/rales  Heart: RRR, no murmur/rub/gallop    Rectal: large pink firm external hemorrhoid, ++tender, unable to do rectal exam d/t size and severity of pain of hemorrhoid    Assessment & Plan  External hemorrhoid  Tender thrombosed external hemorrhoid. Script sent for anusol. Push fluids, cont fiber. Referral placed for gen surg. Colo utd.    Orders:    hydrocortisone (Anusol-HC) 2.5 % rectal cream; Insert into the rectum if needed in the morning, at noon, in the evening, and at bedtime for hemorrhoids (rectal discomfort). Apply to affected areas    INT  [facility]; Future    Hypertensive heart disease without heart failure   BP elevated today, likely secondary to pain. Recheck when he returns next week for CPE            *Follow-up: Follow up for Next scheduled follow-up.    Ronita Cohens, PA-C         [1]   Patient Active Problem List  Diagnosis    Allergic rhinitis    Anxiety and depression     Elevated PSA    History of Helicobacter pylori infection    Hypertension     Insomnia due to anxiety and fear     Aortic root dilatation    Gastroesophageal reflux disease    Sleep apnea    Anxiety    Dysgeusia    Lumbar disc disease with radiculopathy    Paresthesias    Spinal stenosis    Tinnitus of both ears    Hypercholesterolemia     Dilatation of aorta   [2]   Allergies  Allergen Reactions  Boostrix Tdap [Diphth,Pertus(Acell),Tetanus] Swelling    Duloxetine      Other reaction(s): constipation   [3]   Past Medical History:  Diagnosis Date    Allergic rhinitis     Anxiety and depression     [facility] [place] NP, [name] Therapist    Elevated PSA 2014    Dr. Faylene Hoots    History of Helicobacter pylori infection     Hypertension     Insomnia due to anxiety and fear     Sleep apnea 05/19/2021    Spinal stenosis 11/23/2021   [4]   Past Surgical History:  Procedure Laterality Date    PROSTATE BIOPSY  2014    Negative   [5]   Family History  Problem Relation Name Age of Onset    Hypertension Mother Yates Weisgerber         14 yrs old    Hypertension Father Alexandrino Colvin Dec     Kidney failure Father Alexandrino Toro         Dyalsis    Heart failure Father Alexandrino Colvin Dec     Other (spine issues) Sister      No Known Problems Daughter      No Known Problems Daughter      No Known Problems Son      Other  (intesinal cancer) Maternal Grandmother          37 yrs old    Heart failure Maternal Grandfather      No Known Problems Paternal Grandmother          deceased at 66 yrs old    No Known Problems Paternal Grandfather     [6]   Social History  Tobacco Use    Smoking status: Never    Smokeless tobacco: Never   Vaping Use    Vaping status: Never Used   Substance Use Topics    Alcohol use: Yes     Alcohol/week: 2.0 standard drinks of alcohol     Types: 2 Cans of beer per week     Comment: 2-4 times a month    Drug use: Never

## 2023-09-03 ENCOUNTER — Ambulatory Visit
Admit: 2023-09-03 | Discharge: 2023-09-03 | Payer: BLUE CROSS/BLUE SHIELD | Attending: Internal Medicine | Primary: Internal Medicine

## 2023-09-03 DIAGNOSIS — Z0000 Encounter for general adult medical examination without abnormal findings: Secondary | ICD-10-CM

## 2023-09-03 MED ORDER — hydrocortisone (Anusol-HC) 25 mg suppository
25 | Freq: Two times a day (BID) | RECTAL | 0 refills | 30.00000 days | Status: AC | PRN
Start: 2023-09-03 — End: 2023-09-13

## 2023-09-03 NOTE — Assessment & Plan Note (Addendum)
 Orders:    hydrocortisone  (Anusol -HC) 25 mg suppository; Insert 1 suppository (25 mg) into the rectum if needed in the morning and at bedtime for hemorrhoids for up to 10 days.

## 2023-09-03 NOTE — Progress Notes (Signed)
 Chief Complaint:  Chief Complaint   Patient presents with    Annual Exam          Subjective:     Jordan [name] is a 63 y.o. male who is here today for his  annual comprehensive exam.   [place]- 06/23/2020, repeat in 7 years.     Tdap Due  History of Present Illness  The patient presents for evaluation of hemorrhoids, benign prostatic hyperplasia, tinnitus, allergic rhinitis, and health maintenance.    Hemorrhoids  - History of hemorrhoids  - Recently developed a thrombosed hemorrhoid following cycling  - Topical corticosteroid cream has been ineffective  - Scheduled for a surgical consultation  - Maintains adequate hydration and consumes two yogurts daily  - No reports of rectal bleeding  - Reduced food intake due to discomfort during bowel movements    Benign Prostatic Hyperplasia  - Causes discomfort if urination is delayed  - Biopsy confirmed the benign nature of the condition  - Sexually active with satisfactory erectile function  - Has not sought further medical attention due to a previous traumatic procedure that resulted in hematuria    Tinnitus  - Induced by fluoxetine  - Managed with a sound machine    Allergic Rhinitis  - Takes loratadine intermittently    Supplemental information: He reports fluctuating blood pressure levels associated with dietary factors and pain. His last ophthalmological examination in 2024 was unremarkable. He plans to visit [place]  in July 2025. He has been retired for 2 years due to chronic back pain. He denies experiencing chest pain, dyspnea, gastrointestinal disturbances, abdominal pain, or paresthesia in the lower extremities. He has not received a tetanus vaccine since the 1980s due to an allergic reaction that resulted in pyrexia lasting three days. His mood is managed with fluoxetine, which is effective. He is also prescribed clonazepam for anxiety, which he finds beneficial. 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    SOCIAL HISTORY  - Retired for 2 years due to back issues    FAMILY HISTORY  - Mother: hypertension  - Father: kidney failure, heart disease, hypertension  - Sister: back issues    1) Health Maintenance Summary:  Health Maintenance   Topic Date Due    HIV Screening  Never done    MMR Vaccines (1 of 1 - Standard series) Never done    DTaP/Tdap/Td Vaccines (1 - Tdap) Never done    Pneumococcal Vaccine: 50+ Years (1 of 2 - PCV) Never done    Zoster Vaccines (1 of 2) Never done    COVID-19 Vaccine (6 - 2024-25 season) 11/26/2022    Influenza Vaccine (1) 11/26/2023    Depression Monitoring  12/02/2023    Diabetes Screening  09/03/2026    Colorectal Cancer Screening  06/24/2027    Lipid Panel  09/02/2028    Hepatitis C Screening  Completed    HIB Vaccines  Aged Out    Hepatitis B Vaccines  Aged Out    IPV Vaccines  Aged Out    Hepatitis A Vaccines  Aged Out    Meningococcal Vaccine  Aged Out    Rotavirus Vaccines  Aged Out    HPV Vaccines  Aged Out    Meningococcal B Vaccine  Aged Out       2)  Immunizations:  Immunization History   Administered Date(s) Administered    Covid-19 Moderna vaccine bivalent (12y+) 03/15/2021    Covid-19 Moderna vaccine  monovalent (12y+) 05/31/2019, 06/28/2019, 03/13/2020    Influenza, injectable, MDCK, preservative free, quadrivalent 02/05/2021, 02/25/2022    Influenza, injectable, quadrivalent, preservative free 02/25/2020    Influenza, seasonal, injectable 02/06/2018, 12/26/2018, 02/09/2023    Spikevax COVID-19 Vaccine Formula 12+ 03/11/2022             Review Of Systems    Constitutional: Negative for fevers, chills, fatigue, unexpected weight changes.  HEENT: Negative for visual changes, oral lesions, sore throat, epistaxis, rhinorrhea.  Cardiovascular: Negative for chest pain, DOE, palpitations, lower extremity edema.  Respiratory: Negative for dyspnea, cough, or hemoptysis.  Gastrointestinal: Negative for nausea, vomiting, abdominal pain, change in bowel habits, blood or  melena.  Genitourinary: Negative for dysuria, hematuria, frequency, genital discharge/lesions.  Breast: Negative for rash, asymmetry, lumps, pain, nipple discharge.  Musculoskeletal: Negative for muscle pain/weakness, joint pain/swelling, decreased range of motion.  Integumentary: Negative for rashes, pruritus, lesions or masses.   Neurological: Negative for headaches, dizziness, numbness, tingling.  Psychiatric: Negative for depressed mood, insomnia, decreased appetite.  Endocrine: Negative for polyuria, polydipsia, weight change, temperature intolerance, hair loss.  Hematologic/Lymphatic: Negative for easy bleeding or bruising, swelling of the extremities.      Current Medications[1]    Allergies: Boostrix tdap [diphth,pertus(acell),tetanus] and Duloxetine    Medical History[2]    Surgical History[3]    Family History   Problem Relation Name Age of Onset    Hypertension Mother Melquiades Kovar         29 yrs old    Hypertension Father Alexandrino Cary     Kidney failure Father Alexandrino Grenda         Dyalsis    Heart failure Father Alexandrino Cary     Other (spine issues) Sister      No Known Problems Daughter 77     No Known Problems Daughter 57     No Known Problems Son 36     Other (intesinal cancer) Maternal Grandmother          64 yrs old    Heart failure Maternal Grandfather      No Known Problems Paternal Grandmother          deceased at 80 yrs old    No Known Problems Paternal Grandfather         Social History     Socioeconomic History    Marital status: Married     Spouse name: None    Number of children: 3    Years of education: None    Highest education level: None   Occupational History    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  Never smoker, Patient was counseled on the dangers of tobacco use:  11/11/2020.     Alcohol  Did a drink containing alcohol in the last year?  Yes, How often did you have  a drink containing alcohol within the last year?  Two to four times a month (2 points), How many drinks did you have containing alcohol on a typical day in the last year?  1 or 2 (0 points), How often did you have six or more drinks containing alcohol within the last year?  Never (0 points), Points  2, Interpretation  Negative.     Alcohol (TEXT ONLY): socially.    no Drug use.    Marital Status: married, 36 yrs married.    Children: sons:1 daughters:2.    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 2022- 28 hours ( given back pain) Retired     no Exercise, active job walking for exercise, stretching w/ inversion table..    no Caffeine, due to high blood pressure.    Sexually active: Had sex in the last 12 months (vaginal,anal,oral): Yes, with: Women only, Use Protection?: No, Prevention strategies discussed: Other, Have you ever had an STD?: No.    Travel ouside [place] : none planned..                 Social Determinants of Health     Financial Resource Strain: Low Risk  (08/21/2022)    Overall Financial Resource Strain (CARDIA)     Difficulty of Paying Living Expenses: Not hard at all   Food Insecurity: Unknown (08/21/2022)    Hunger Vital Sign     Worried About Running Out of Food in the Last Year: Never true     Ran Out of Food in the Last Year: Not on file   Transportation Needs: No Transportation Needs (08/21/2022)    PRAPARE - Therapist, art (Medical): No     Lack of Transportation (Non-Medical): No   Physical Activity: Not on file   Stress: Not on file   Social Connections: Not on file   Intimate Partner Violence: Not on file   Housing Stability:  Unknown (08/21/2022)    Housing Stability Vital Sign     Unable to Pay for Housing in the Last Year: Not on file     Number of Places Lived in the Last Year: Not on file     Unstable Housing in the Last Year: No       Objective:     Previous pertinent lab results:  Lab Results   Component Value Date    WBC 9.6 09/03/2023    HGB 14.8 09/03/2023    HCT 44.0 09/03/2023    PLT 283 09/03/2023    CHOL 217 (H) 09/03/2023    TRIG 111 09/03/2023    HDL 49 09/03/2023    LDL 133 (H) 08/28/2022    ALT 27 09/03/2023    AST 25 09/03/2023    NA 136 09/03/2023    K 4.4 09/03/2023    CL 96 09/03/2023    CREATININE 1.00 09/03/2023    BUN 18 09/03/2023    CO2 21 09/03/2023    PSA 1.98 11/11/2020    GLUCOSE 95 09/03/2023    HGBA1C 6.0 (H) 09/03/2023    MICROALBUR 1.0 03/14/2023     Results  Prostate Biopsy: Benign    Vitals:    09/03/23 1549   BP: 136/86   BP Location: Right arm   Patient Position: Sitting   BP Cuff Size: Adult   Pulse: 60   Temp: 36.6 C (97.8 F)   TempSrc: Oral   SpO2: 97%   Weight: 81.6 kg   Height: 1.676 m  Body mass index is 29.05 kg/m.    Physical Exam:  General Appearance: Pleasant, no acute distress, well developed, well nourished.  Head: Normocephalic  Eyes: Conjunctiva/corneas clear, PERRLA, EOM's intact  Ears: Normal TM's and external ear canals, both ears  Nose: Nares patent  Throat: Clear  Neck: Supple, no adenopathy, no thyromegaly  Back: FROM  Lungs: Clear to auscultation bilaterally  Heart: Regular rate and rhythm, S1 and S2 normal, no murmur, rub or gallop  Breast Exam: No dimpling, no tenderness, no masses, no nipple discharge  Abdomen: Soft, non-tender, bowel sounds active all four quadrants, no masses, no organomegaly  Genitalia: External genitalia normal without lesions,penis normal,   scrotum no mass,    rectal normal, prostate w/ out masses  Extremities: Extremities normal, atraumatic, no cyanosis or edema  Pulses: 2+ and symmetric throughout  Skin: Turgor normal, no rashes or suspicious  lesions  Neurologic: CNII-XII intact, normal strength, sensation and reflexes throughout    Physical Exam    Rectal: External hemorrhoid, inflamed but not thrombosed    Assessment and Plan:     Assessment & Plan  External hemorrhoid    Orders:    hydrocortisone  (Anusol -HC) 25 mg suppository; Insert 1 suppository (25 mg) into the rectum if needed in the morning and at bedtime for hemorrhoids for up to 10 days.    Mild persistent asthma without complication (Multi-HCC)         Hypertensive heart disease without heart failure   Blood pressure  goal ...discussed importance of keeping blood pressure well controlled. DASH diet. Take medications daily as prescribed. Regular cardiovascular exercise 30 min. most days of the week. Goal < 125/75         Mixed hyperlipidemia   Check lipids       Routine adult health maintenance  counselled re: healthy lifestyle, healthy diet and exercise recommended.  BMI goal for age less than 25. Immunizations are up to date.  Recommended flu shot.   regular dental and eye exam advised. Covid precautions reviewed with patient. colonoscopy updated.    Orders:    CBC and differential    Comprehensive metabolic panel    Lipid panel    Hemoglobin A1c    PSA Total, Screen      Assessment & Plan  1. Hemorrhoids.  - Herniated hemorrhoid, unresponsive to cortisone cream.  - Prescribed suppositories, advised Vaseline or pad twice daily.  - Recommended prunes or stool softeners, maintain hydration, continue yogurt.  - Surgical consultation on 09/20/2023, may cancel if symptoms improve.    2. Prostate enlargement.  - Benign biopsy, discomfort if urination delayed.  - Monitor symptoms, follow up with urologist if necessary.    3. Tinnitus.  - Likely exacerbated by Prozac, uses sound machine at night.  - No medication changes.  - History of loud noise exposure from occupation.    4. Allergies.  - Takes Claritin occasionally.  - Advised daily use until symptoms improve, then as needed.  - Slightly red  throat likely due to allergies.    Patient Health Questionnaire-9 Score: 0 Interpretation: Negative screening.      Follow-up & Interventions:   - Maintain annual screening - No additional Follow-up required.    Other Patient Counseling   --Nutrition/Weight Management: importance of moderation in sodium/caffeine intake, saturated fat and cholesterol, caloric balance, sufficient intake of fresh fruits, vegetables, fiber, calcium, iron,   --Exercise:  importance of regular exercise.   --Substance Abuse:  prevention of tobacco, alcohol, or other  drug use; driving or other dangerous activities under the influence; availability of treatment for abuse.    --Sexuality: sexually transmitted diseases, partner selection, use of condoms, and contraceptive alternatives.   --Injury prevention: safety belts, safety helmets, smoke detector, smoking near bedding or upholstery.   --Dental health: importance of regular tooth brushing, flossing, and dental visits.  --Immunizations reviewed.  --Discussed benefits of screening colonoscopy , testicular exams.       Follow-Up:  Follow up 1 year TM.         ORLENE FELT, MD   This visit note was drafted with the help of artificial intelligence. I obtained consent to record the visit from all participants for this purpose.         [1]   Current Outpatient Medications   Medication Sig Dispense Refill    clonazePAM (KlonoPIN) 0.5 mg tablet Take 1 mg by mouth in the morning.      FLUoxetine (PROzac) 20 mg capsule Take 1 capsule by mouth 1 (one) time each day at the same time.      fluticasone  (Flovent ) 110 mcg/actuation inhaler Inhale 1 puff in the morning and at bedtime. Rinse mouth with water after use to reduce aftertaste and incidence of candidiasis. Do not swallow. 12 g 6    hydrocortisone  (Anusol -HC) 2.5 % rectal cream Insert into the rectum if needed in the morning, at noon, in the evening, and at bedtime for hemorrhoids (rectal discomfort). Apply to affected areas 30 g 0     lisinopriL -hydrochlorothiazide  20-12.5 mg tablet TAKE 2 TABLETS BY MOUTH ONCE DAILY AT THE SAME TIME. 180 tablet 3     No current facility-administered medications for this visit.   [2]   Past Medical History:  Diagnosis Date    Allergic rhinitis     Anxiety and depression      [facility] [place] NP, [facility] Therapist    Elevated PSA 2014    Dr. Gust    History of Helicobacter pylori infection     Hypertension      Insomnia due to anxiety and fear      Sleep apnea 05/19/2021    Spinal stenosis 11/23/2021   [3]   Past Surgical History:  Procedure Laterality Date    PROSTATE BIOPSY  2014    Negative

## 2023-09-03 NOTE — Assessment & Plan Note (Addendum)
 Blood pressure  goal ...discussed importance of keeping blood pressure well controlled. DASH diet. Take medications daily as prescribed. Regular cardiovascular exercise 30 min. most days of the week. Goal < 125/75

## 2023-09-03 NOTE — Assessment & Plan Note (Addendum)
 Check lipids

## 2023-09-04 LAB — COMPREHENSIVE METABOLIC PANEL
ALT: 27 IU/L (ref 0–44)
AST: 25 IU/L (ref 0–40)
Albumin: 4.7 g/dL (ref 3.9–4.9)
Alk Phosphatase: 58 IU/L (ref 44–121)
Anion Gap: 19 mmol/L — ABNORMAL HIGH (ref 10.0–18.0)
BUN/Creat Ratio: 18 (ref 10–24)
BUN: 18 mg/dL (ref 8–27)
Bili Total: 0.6 mg/dL (ref 0.0–1.2)
Calcium: 10 mg/dL (ref 8.6–10.2)
Carbon Dioxide: 21 mmol/L (ref 20–29)
Chloride: 96 mmol/L (ref 96–106)
Creat: 1 mg/dL (ref 0.76–1.27)
Globulin Total: 2.5 g/dL (ref 1.5–4.5)
Glucose: 95 mg/dL (ref 70–99)
Potassium: 4.4 mmol/L (ref 3.5–5.2)
Protein Total: 7.2 g/dL (ref 6.0–8.5)
Sodium: 136 mmol/L (ref 134–144)
eGFR: 84 mL/min/{1.73_m2} (ref >59)

## 2023-09-04 LAB — CBC W/DIFF
Baso Abs: 0 10*3/uL (ref 0.0–0.2)
Basos: 0 %
Eos Abs: 0.2 10*3/uL (ref 0.0–0.4)
Eos: 2 %
Hct: 44 % (ref 37.5–51.0)
Hgb: 14.8 g/dL (ref 13.0–17.7)
Immature Grans Abs: 0 10*3/uL (ref 0.0–0.1)
Immature Granulocytes: 0 %
Lymphs Abs: 2 10*3/uL (ref 0.7–3.1)
Lymphs: 21 %
MCH: 28.5 pg (ref 26.6–33.0)
MCHC: 33.6 g/dL (ref 31.5–35.7)
MCV: 85 fL (ref 79–97)
Monocytes: 9 %
MonocytesAbs: 0.9 10*3/uL (ref 0.1–0.9)
Neutrophils Abs: 6.4 10*3/uL (ref 1.4–7.0)
Neutrophils: 68 %
Platelets: 283 10*3/uL (ref 150–450)
RBC: 5.19 x10E6/uL (ref 4.14–5.80)
RDW: 13 % (ref 11.6–15.4)
WBC: 9.6 10*3/uL (ref 3.4–10.8)

## 2023-09-04 LAB — HEMOGLOBIN A1C: HgbA1C: 6 % — ABNORMAL HIGH (ref 4.8–5.6)

## 2023-09-04 LAB — LIPID PANEL
Cholesterol: 217 mg/dL — ABNORMAL HIGH (ref 100–199)
HDL Cholesterol: 49 mg/dL (ref >39)
LDLc Calc (NIH): 148 mg/dL — ABNORMAL HIGH (ref 0–99)
Non-HDL Chol: 168 mg/dL — ABNORMAL HIGH (ref 0–129)
Triglycerides: 111 mg/dL (ref 0–149)
VLDLc Calc: 20 mg/dL (ref 5–40)

## 2023-09-04 LAB — PSA TOTAL, SCREEN: PSA TOTAL: 3.6 ng/mL (ref 0.0–4.0)

## 2023-09-20 ENCOUNTER — Ambulatory Visit
Admit: 2023-09-20 | Discharge: 2023-09-20 | Payer: BLUE CROSS/BLUE SHIELD | Attending: Colon & Rectal Surgery | Primary: Internal Medicine

## 2023-09-20 DIAGNOSIS — K644 Residual hemorrhoidal skin tags: Secondary | ICD-10-CM

## 2023-09-20 NOTE — Progress Notes (Signed)
 [facility]  [address]  [place], [place] [address]  [phone]  Jordan Farr, MD  Date of Encounter:  09/20/23    CHIEF COMPLAINT:  HEMORRHOIDS    HPI:  59M seen at his PCP's office at the start of the month 2 weeks after going on a long bike ride and developing a painful hemorrhoid.  It was noted to be large, pink, firm, external hemorrhoid which was very painful.  He was given Rx for some HC suppositories and it has slowly gotten better.  Was seen for his physical on 6/9 and no mention of it was made and he was able to tolerate a prostate exam without issue.  He has never had anything like this before.  There was one episode of bleeding during this period but it was only on the paper and he thinks he was wiping too hard.  There was pain with BMs and with wiping afterwards at the start but this has resolved.  He can still feel something there and it did use to swell a bit with BMs but no longer does that.  He otherwise denies associated symptoms of diarrhea, discharge, pruritus, prolapse of tissue, or incontinence to gas or stool.  No previous anorectal procedures.    In terms of bowel habits he reports 1-2 BMs a day.  He rarely skips.  He notes he is now on a high yogurt diet and stools are soft now but used to be harder.  He no longer has to strain but again used to.  He sits for 5 minutes plus to go.  There is occasional sensation of incomplete evacuation and this is why he sits so long sometimes and will strain some but then just get up. Cleans with wipes now and does not dry.    He had his last colonoscopy on 06/28/20 with Dr. Patria and had TA x 2 noted and is due for surveillance at 7 years.  There is no family history of CRC or IBD.  He denies any recent travel.  No anal sex or play.    ROS:  Review of Systems   Gastrointestinal:  Negative for abdominal pain, blood in stool, constipation, diarrhea, nausea and vomiting.   All other systems reviewed and are negative.      Current  Outpatient Medications   Medication Instructions    clonazePAM (KLONOPIN) 1 mg, Daily    FLUoxetine (PROzac) 20 mg capsule 1 capsule, Every 24 hours    fluticasone  (Flovent ) 110 mcg/actuation inhaler 1 puff, inhalation, 2 times daily RT, Rinse mouth with water after use to reduce aftertaste and incidence of candidiasis. Do not swallow.    hydrocortisone  (Anusol -HC) 2.5 % rectal cream rectal, 4 times daily PRN, Apply to affected areas    lisinopriL -hydrochlorothiazide  20-12.5 mg tablet 2 tablets, oral, Every 24 hours     Allergies[1]  Medical History[2]  Surgical History[3]  Family History[4]  Social History[5]    PHYSICAL EXAM:  Physical Exam  Vitals reviewed. Exam conducted with a chaperone present.   Constitutional:       General: He is not in acute distress.     Appearance: Normal appearance. He is not ill-appearing.   HENT:      Head: Normocephalic and atraumatic.      Right Ear: External ear normal.      Left Ear: External ear normal.      Nose: Nose normal.      Mouth/Throat:  Mouth: Mucous membranes are moist.   Eyes:      Extraocular Movements: Extraocular movements intact.      Conjunctiva/sclera: Conjunctivae normal.   Cardiovascular:      Rate and Rhythm: Normal rate and regular rhythm.   Pulmonary:      Effort: Pulmonary effort is normal. No respiratory distress.      Breath sounds: No wheezing or rhonchi.   Abdominal:      General: There is no distension.      Palpations: Abdomen is soft.      Tenderness: There is no abdominal tenderness. There is no guarding or rebound.   Genitourinary:      Musculoskeletal:         General: Normal range of motion.      Cervical back: Normal range of motion.   Skin:     General: Skin is warm and dry.   Neurological:      General: No focal deficit present.      Mental Status: He is alert. Mental status is at baseline.   Psychiatric:         Mood and Affect: Mood normal.         Thought Content: Thought content normal.         Judgment: Judgment normal.      ASSESSMENT AND PLAN:  Issues Addressed         Codes      Residual hemorrhoidal skin tags    -  Primary K64.4      Personal history of adenomatous and serrated colon polyps     Z86.0101        66M with likely inflamed external hemorrhoid/skin tag or inflammatory reaction to small thrombosed external hemorrhoid.  Is resolving.  The anatomy of skin tags was reviewed.  Explained that this was just excess skin likely from enlargement of the external hemorrhoids previously.  The only way to get rid of them is with excision which is very painful and they can recur.  We discussed that excision is reasonable so that he can get back to his activity as it is affecting his QOL.  Likewise we could give it some more time as it's really been only been about 3 weeks since it was bad and may still get a bit better.  He favors the latter.  We discussed icing the area if it gets swollen gain and/or utilizing topical HC or suppository (although mostly external and so topical may be better) to help with the swelling and discomfort.  Will likely end up with residual skin tag but if it doesn't bother him can just leave it be.  He will see me as needed.    Does also have a personal history of adenomatous polyps but is plugged in with GI for surveillance of this and will defer to them on this.    Jordan Farr, MD       [1]   Allergies  Allergen Reactions    Boostrix Tdap [Diphth,Pertus(Acell),Tetanus] Swelling    Duloxetine      Other reaction(s): constipation   [2]   Past Medical History:  Diagnosis Date    Allergic rhinitis     Anxiety and depression      [facility] [place] NP, [name] Therapist    Elevated PSA 2014    Dr. Gust    History of Helicobacter pylori infection     Hypertension      Insomnia due to anxiety and fear  Sleep apnea 05/19/2021    Spinal stenosis 11/23/2021   [3]   Past Surgical History:  Procedure Laterality Date    PROSTATE BIOPSY  2014    Negative   [4]   Family History  Problem  Relation Name Age of Onset    Hypertension Mother Laird Runnion         24 yrs old    Hypertension Father Alexandrino Cary     Kidney failure Father Alexandrino Hun         Dyalsis    Heart failure Father Alexandrino Cary     Other (spine issues) Sister      No Known Problems Daughter 105     No Known Problems Daughter 68     No Known Problems Son 36     Other (intesinal cancer) Maternal Grandmother          62 yrs old    Heart failure Maternal Grandfather      No Known Problems Paternal Grandmother          deceased at 33 yrs old    No Known Problems Paternal Grandfather     [5]   Social History  Socioeconomic History    Marital status: Married     Spouse name: Not on file    Number of children: 3    Years of education: Not on file    Highest education level: Not on file   Occupational History    Occupation: [employer]- [employer]   Tobacco Use    Smoking status: Never    Smokeless tobacco: Never   Vaping Use    Vaping status: Never Used   Substance and Sexual Activity    Alcohol use: Yes     Alcohol/week: 2.0 standard drinks of alcohol     Types: 2 Cans of beer per week     Comment: 2-4 times a month    Drug use: Never    Sexual activity: Yes     Partners: Female   Other Topics Concern    Not on file   Social History Narrative    Social History    Tobacco Use  Are you a?  Never smoker, Patient was counseled on the dangers of tobacco use:  11/11/2020.     Alcohol  Did a drink containing alcohol in the last year?  Yes, How often did you have a drink containing alcohol within the last year?  Two to four times a month (2 points), How many drinks did you have containing alcohol on a typical day in the last year?  1 or 2 (0 points), How often did you have six or more drinks containing alcohol within the last year?  Never (0 points), Points  2, Interpretation  Negative.     Alcohol (TEXT ONLY): socially.    no Drug use.    Marital Status: married, 36 yrs married.    Children: sons:1 daughters:2.    Occupation:  [employer] - [employer]. 2022- 28 hours ( given back pain) Retired     no Exercise, active job walking for exercise, stretching w/ inversion table..    no Caffeine, due to high blood pressure.    Sexually active: Had sex in the last 12 months (vaginal,anal,oral): Yes, with: Women only, Use Protection?: No, Prevention strategies discussed: Other, Have you ever had an STD?: No.    Travel ouside [place] : none planned.SABRA  Social Determinants of Health     Financial Resource Strain: Low Risk  (08/21/2022)    Overall Financial Resource Strain (CARDIA)     Difficulty of Paying Living Expenses: Not hard at all   Food Insecurity: Unknown (08/21/2022)    Hunger Vital Sign     Worried About Running Out of Food in the Last Year: Never true     Ran Out of Food in the Last Year: Not on file   Transportation Needs: No Transportation Needs (08/21/2022)    PRAPARE - Therapist, art (Medical): No     Lack of Transportation (Non-Medical): No   Physical Activity: Not on file   Stress: Not on file   Social Connections: Not on file   Intimate Partner Violence: Not on file   Housing Stability: Unknown (08/21/2022)    Housing Stability Vital Sign     Unable to Pay for Housing in the Last Year: Not on file     Number of Places Lived in the Last Year: Not on file     Unstable Housing in the Last Year: No

## 2023-10-11 NOTE — Telephone Encounter (Signed)
 Last Visit:      09/03/2023   Next Visit:       10/06/2024  Last Labs:       Last Filled:   08/28/2022

## 2024-03-18 ENCOUNTER — Ambulatory Visit
Admit: 2024-03-18 | Discharge: 2024-03-18 | Payer: BLUE CROSS/BLUE SHIELD | Attending: Medical | Primary: Internal Medicine

## 2024-03-18 DIAGNOSIS — I119 Hypertensive heart disease without heart failure: Principal | ICD-10-CM

## 2024-03-18 MED ORDER — CLONAZEPAM 0.5 MG TABLET
0.5 | ORAL_TABLET | Freq: Two times a day (BID) | ORAL | 0 refills | 30.00000 days | Status: AC
Start: 2024-03-18 — End: ?

## 2024-03-18 NOTE — Progress Notes (Signed)
 Chief Complaint:  Chief Complaint   Patient presents with    Follow-up     6 mo BP F/U      This visit note was drafted with the help of artificial intelligence. I obtained consent to record the visit from all participants for this purpose.    BERNADENE REE, PA     Subjective:     Jordan [name] is a 63 y.o. male here today for   f/u   History of Present Illness  The patient is a 63 year old male who presents today for a follow-up BP.   Pre DM. He changed diet. Eating healthier.   ++ stress  w/ his mother  69.     And political landscape  Expecting another grandchild.      He reports no chest pain, shortness of breath, vision changes, or headaches. He received an influenza vaccine on 03/08/2024.    He continues to take fluoxetine and clonazepam , follows psychiatry.   Fluoxetine and  1 mg of clonazepam  twice daily, which he finds beneficial in coping with his stressors.         Health maintenance updates:  Health Maintenance   Topic Date Due    HIV Screening  Never done    MMR Vaccines (1 of 1 - Standard series) Never done    DTaP/Tdap/Td Vaccines (1 - Tdap) Never done    Pneumococcal Vaccine: 50+ Years (1 of 2 - PCV) Never done    Zoster Vaccines (1 of 2) Never done    COVID-19 Vaccine (6 - 2025-26 season) 11/26/2023    Diabetes Screening  09/03/2026    Colorectal Cancer Screening  06/24/2027    Lipid Panel  09/02/2028    Depression Screening  Completed    Influenza Vaccine  Completed    HPV Vaccines (No Doses Required) Completed    Hepatitis C Screening  Completed    HIB Vaccines  Aged Out    Hepatitis B Vaccines  Aged Out    IPV Vaccines  Aged Out    Hepatitis A Vaccines  Aged Out    Meningococcal Vaccine  Aged Out    Rotavirus Vaccines  Aged Out    Meningococcal B Vaccine  Aged Out    Depression Monitoring  Discontinued         Review of Systems    Negative except hpi    Current Outpatient Medications   Medication Sig Dispense Refill    FLUoxetine (PROzac) 20 mg capsule Take 1 capsule by mouth 1 (one) time each  day at the same time.      lisinopriL -hydrochlorothiazide  20-12.5 mg tablet TAKE 2 TABLETS BY MOUTH ONCE DAILY AT THE SAME TIME. 180 tablet 3    clonazePAM  (KlonoPIN ) 0.5 mg tablet Take 2 tablets (1 mg) by mouth twice daily. 60 tablet 0    hydrocortisone  (Anusol -HC) 2.5 % rectal cream Insert into the rectum if needed in the morning, at noon, in the evening, and at bedtime for hemorrhoids (rectal discomfort). Apply to affected areas 30 g 0     No current facility-administered medications for this visit.       Allergies: Boostrix tdap [diphth,pertus(acell),tetanus] and Duloxetine    Immunization History   Administered Date(s) Administered    Covid-19 Moderna vaccine bivalent (12y+) 03/15/2021    Covid-19 Moderna vaccine monovalent (12y+) 05/31/2019, 06/28/2019, 03/13/2020    Influenza, Recombinant, injectable, preservative free 03/08/2024    Influenza, injectable, MDCK, preservative free, quadrivalent 02/05/2021, 02/25/2022    Influenza, injectable, quadrivalent,  preservative free 02/25/2020    Influenza, seasonal, injectable 02/06/2018, 12/26/2018, 02/09/2023    Spikevax COVID-19 Vaccine Formula 12+ 03/11/2022     Past Medical History:   Diagnosis Date    Allergic rhinitis     Anxiety and depression     [facility] [place] NP, [name] Therapist    Elevated PSA 2014    Dr. Gust    History of Helicobacter pylori infection     Hypertension     Insomnia due to anxiety and fear     Sleep apnea 05/19/2021    Spinal stenosis 11/23/2021       Past Surgical History:   Procedure Laterality Date    COLONOSCOPY      PROSTATE BIOPSY  2014    Negative       Family History   Problem Relation Name Age of Onset    Hypertension Mother Abdulai Blaylock         73 yrs old    Hypertension Father Alexandrino Cary     Kidney failure Father Alexandrino Cutrone         Dyalsis    Heart failure Father Alexandrino Cary     Other (spine issues) Sister      No Known Problems Daughter 65     No Known Problems Daughter 43      No Known Problems Son 50     Other (intesinal cancer) Maternal Grandmother          54 yrs old    Heart failure Maternal Grandfather      No Known Problems Paternal Grandmother          deceased at 39 yrs old    No Known Problems Paternal Grandfather         Social History     Socioeconomic History    Marital status: Married     Spouse name: None    Number of children: 3    Years of education: None    Highest education level: None   Occupational History    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  Never smoker, Patient was counseled on the dangers of tobacco use:  11/11/2020.     Alcohol  Did a drink containing alcohol in the last year?  Yes, How often did you have a drink containing alcohol within the last year?  Two to four times a month (2 points), How many drinks did you have containing alcohol on a typical day in the last year?  1 or 2 (0 points), How often did you have six or more drinks containing alcohol within the last year?  Never (0 points), Points  2, Interpretation  Negative.     Alcohol (TEXT ONLY): socially.    no Drug use.    Marital Status: married, 36 yrs married.  Children: sons:1 daughters:2.    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 2022- 28 hours ( given back pain) Retired     no Exercise, active job walking for exercise, stretching w/ inversion table..    no Caffeine, due to high blood pressure.    Sexually active: Had sex in the last 12 months (vaginal,anal,oral): Yes, with: Women only, Use Protection?: No, Prevention strategies discussed:  Other, Have you ever had an STD?: No.    Travel ouside [place] : none planned..                 Social Determinants of Health     Financial Resource Strain: Low Risk (03/17/2024)    Overall Financial Resource Strain (CARDIA)     Difficulty of Paying Living Expenses: Not hard at all   Food Insecurity: Unknown (03/17/2024)    Hunger Vital Sign     Worried About Running Out of Food in the Last Year: Never true     Ran Out of Food in the Last Year: Not on file   Transportation Needs: No Transportation Needs (03/17/2024)    PRAPARE - Therapist, Art (Medical): No     Lack of Transportation (Non-Medical): No   Physical Activity: Not on file   Stress: Not on file   Social Connections: Not on file   Intimate Partner Violence: Not on file   Housing Stability: Unknown (03/17/2024)    Housing Stability Vital Sign     Unable to Pay for Housing in the Last Year: Not on file     Number of Times Moved in the Last Year: Not on file     Homeless in the Last Year: No       Objective:      Most recent pertinent labs:  Lab Results   Component Value Date    WBC 9.6 09/03/2023    HGB 14.8 09/03/2023    HCT 44.0 09/03/2023    PLT 283 09/03/2023    CHOL 217 (H) 09/03/2023    TRIG 111 09/03/2023    HDL 49 09/03/2023    LDL 133 (H) 08/28/2022    ALT 27 09/03/2023    AST 25 09/03/2023    ALKPHOS 58 09/03/2023    NA 136 09/03/2023    K 4.4 09/03/2023    CL 96 09/03/2023    CREATININE 1.00 09/03/2023    BUN 18 09/03/2023    CO2 21 09/03/2023    PSA 1.98 11/11/2020    GLUCOSE 95 09/03/2023    HGBA1C 6.0 (H) 09/03/2023    MICROALBUR 1.0 03/14/2023       Vitals:    03/18/24 1344 03/18/24 1416   BP: (!) 140/90 122/84   BP Location: Right arm    Patient Position: Sitting    BP Cuff Size: Adult    Pulse: 57    Temp: 36.8 C (98.2 F)    TempSrc: Oral    SpO2: 97%    Weight: 83 kg    Height: 1.676 m    Body mass index is 29.54 kg/m.    Physical Exam:  General Appearance: Alert, cooperative, no distress   HEENT: Normocephalic,   Eyes-anicteric.   Ears normal.  Nose normal.   Pharynx pink and moist  Neck:   Supple,  Normal ROM.   No thyromegaly, No bruits, no LAD  Lungs:  Clear to auscultation bilaterally, respirations unlabored  Heart: Regular rate and rhythm, S1 and S2 normal, no murmur, rub  or gallop  Extremities: Extremities normal, atraumatic, no cyanosis or edema  Pulses: 2+ and symmetric all extremities  Skin: Skin color, texture, turgor normal, no rashes or lesions      Assessment/Plan:     Tramayne was seen today for follow-up.    Diagnoses and all orders for this visit:    Hypertensive heart disease without heart failure    Mild persistent asthma without complication    Mixed hyperlipidemia    Obstructive sleep apnea syndrome    Generalized anxiety disorder    Prediabetes  -     Basic metabolic panel; Future  -     Albumin, Urine, Random (Microalbumin Random, Including Creatinine)  -     Hemoglobin A1c; Future  -     Basic metabolic panel  -     Hemoglobin A1c    Other orders  -     clonazePAM  (KlonoPIN ) 0.5 mg tablet; Take 2 tablets (1 mg) by mouth twice daily.       Assessment & Plan  1. Hypertension:  - Blood pressure readings have shown a decrease, with home measurements averaging around 127/78.  - Current regimen of clonazepam  1 mg twice a day appears effective in managing stress and blood pressure.  - Advised to continue monitoring blood pressure at home and maintain current medication regimen.  - Blood pressure in the office was 122/84.    2. Anxiety:  - Reports increased stress levels due to family issues.  - Currently taking fluoxetine and clonazepam  1 mg twice a day, which helps cope with anxiety.  - Advised to continue with current medications and follow up with mental health clinician as scheduled.  - No recent flare-ups since increasing clonazepam  dosage.    3. Health maintenance:  - Received influenza vaccine on 03/08/2024.  - Laboratory tests will be conducted today to assess A1c levels, kidney function, blood glucose  levels, and urine protein.  - Made dietary changes, including avoiding ice cream for 6 months, to improve cholesterol and sugar levels.       *Follow-up: Follow up for as planned, sooner prn. SABRA    BERNADENE REE, PA      Answers submitted by the patient for this visit:  Follow Up Appointment on 03/18/2024  2:00 PM with Bernadene Ree, PA  High Blood Pressure Questionnaire (Submitted on 03/17/2024)  Chief Complaint: Hypertension  Chronicity: chronic  Onset: more than 1 year ago  Progression since onset: waxing and waning  Condition status: resistant  anxiety: Yes  blurred vision: No  chest pain: No  headaches: No  malaise/fatigue: No  neck pain: No  orthopnea: No  palpitations: No  peripheral edema: No  PND: No  shortness of breath: No  sweats: No  Agents associated with hypertension: NSAIDs  CAD risks: no known risk factors  Compliance problems: diet, exercise

## 2024-03-19 LAB — BASIC METABOLIC PANEL
CALCIUM: 9.8 mg/dL (ref 8.6–10.3)
CARBON DIOXIDE: 32 mmol/L (ref 20–32)
CHLORIDE: 94 mmol/L — ABNORMAL LOW (ref 98–110)
CREATININE: 0.97 mg/dL (ref 0.70–1.35)
EGFR: 87 mL/min/1.73m2 (ref >=60)
GLUCOSE: 90 mg/dL (ref 65–99)
POTASSIUM: 4.5 mmol/L (ref 3.5–5.3)
SODIUM: 133 mmol/L — ABNORMAL LOW (ref 135–146)
UREA NITROGEN (BUN): 14 mg/dL (ref 7–25)

## 2024-03-19 LAB — ALBUMIN, URINE, RANDOM (INCLUDING CREATININE)
ALBUMIN, URINE: 2.2 mg/dL
ALBUMIN/CREATININE RATIO, RANDOM URINE: 18 mg/g{creat} (ref <30)
CREATININE, RANDOM URINE: 125 mg/dL (ref 20–320)

## 2024-03-19 LAB — HEMOGLOBIN A1C: HEMOGLOBIN A1C: 5.7 % — ABNORMAL HIGH (ref <5.7)

## 2024-03-21 NOTE — Result Encounter Note (Signed)
 Monish, the  sodium is a little lower than yout usual. I'd like to repeat it in a few weeks.  The a1c is down!     Great.    I will send an order to [facility] ( our office ) to get this done when you can. Thank you, Basilia Stuckert

## 2024-04-28 LAB — BASIC METABOLIC PANEL
CALCIUM: 9.9 mg/dL (ref 8.6–10.3)
CARBON DIOXIDE: 31 mmol/L (ref 20–32)
CHLORIDE: 94 mmol/L — ABNORMAL LOW (ref 98–110)
CREATININE: 0.94 mg/dL (ref 0.70–1.35)
EGFR: 91 mL/min/{1.73_m2} (ref >=60)
GLUCOSE: 103 mg/dL — ABNORMAL HIGH (ref 65–99)
POTASSIUM: 4.1 mmol/L (ref 3.5–5.3)
SODIUM: 134 mmol/L — ABNORMAL LOW (ref 135–146)
UREA NITROGEN (BUN): 15 mg/dL (ref 7–25)
